# Patient Record
Sex: Female | Born: 1955 | Race: Black or African American | Hispanic: No | Marital: Single | Smoking: Never smoker
Health system: Southern US, Community
[De-identification: ages and names within clinical notes are randomized; demographics above are authoritative.]

## PROBLEM LIST (undated history)

## (undated) DIAGNOSIS — E119 Type 2 diabetes mellitus without complications: Secondary | ICD-10-CM

## (undated) DIAGNOSIS — M1990 Unspecified osteoarthritis, unspecified site: Secondary | ICD-10-CM

## (undated) DIAGNOSIS — I10 Essential (primary) hypertension: Secondary | ICD-10-CM

## (undated) DIAGNOSIS — E785 Hyperlipidemia, unspecified: Secondary | ICD-10-CM

## (undated) HISTORY — PX: OTHER SURGICAL HISTORY: SHX169

## (undated) HISTORY — DX: Hyperlipidemia, unspecified: E78.5

## (undated) HISTORY — DX: Type 2 diabetes mellitus without complications: E11.9

## (undated) HISTORY — DX: Unspecified osteoarthritis, unspecified site: M19.90

## (undated) HISTORY — DX: Essential (primary) hypertension: I10

---

## 2001-06-11 HISTORY — PX: ABDOMINAL HYSTERECTOMY: SHX81

## 2004-03-14 ENCOUNTER — Other Ambulatory Visit: Payer: Self-pay

## 2004-03-14 ENCOUNTER — Emergency Department: Payer: Self-pay | Admitting: Emergency Medicine

## 2004-08-17 ENCOUNTER — Ambulatory Visit: Payer: Self-pay | Admitting: Obstetrics and Gynecology

## 2006-04-24 ENCOUNTER — Ambulatory Visit: Payer: Self-pay | Admitting: Internal Medicine

## 2006-12-23 LAB — HM COLONOSCOPY: HM COLON: NORMAL

## 2007-06-30 ENCOUNTER — Ambulatory Visit: Payer: Self-pay | Admitting: Gastroenterology

## 2007-08-05 ENCOUNTER — Ambulatory Visit: Payer: Self-pay | Admitting: Internal Medicine

## 2007-10-23 ENCOUNTER — Ambulatory Visit: Payer: Self-pay

## 2008-01-12 ENCOUNTER — Other Ambulatory Visit: Payer: Self-pay

## 2008-01-12 ENCOUNTER — Emergency Department: Payer: Self-pay | Admitting: Emergency Medicine

## 2008-04-27 ENCOUNTER — Ambulatory Visit: Payer: Self-pay | Admitting: Unknown Physician Specialty

## 2008-06-23 ENCOUNTER — Ambulatory Visit: Payer: Self-pay | Admitting: Family Medicine

## 2008-06-29 ENCOUNTER — Ambulatory Visit: Payer: Self-pay | Admitting: Internal Medicine

## 2008-07-06 ENCOUNTER — Ambulatory Visit: Payer: Self-pay | Admitting: Internal Medicine

## 2008-11-17 ENCOUNTER — Ambulatory Visit: Payer: Self-pay | Admitting: Obstetrics and Gynecology

## 2009-06-15 ENCOUNTER — Emergency Department: Payer: Self-pay | Admitting: Emergency Medicine

## 2009-09-19 LAB — HM PAP SMEAR: HM PAP: NORMAL

## 2014-08-23 LAB — HM DIABETES FOOT EXAM: HM DIABETIC FOOT EXAM: NORMAL

## 2014-10-25 LAB — HM MAMMOGRAPHY: HM Mammogram: NEGATIVE

## 2015-04-08 ENCOUNTER — Encounter: Payer: Self-pay | Admitting: Nurse Practitioner

## 2015-04-08 ENCOUNTER — Ambulatory Visit (INDEPENDENT_AMBULATORY_CARE_PROVIDER_SITE_OTHER): Payer: BLUE CROSS/BLUE SHIELD | Admitting: Nurse Practitioner

## 2015-04-08 VITALS — BP 124/82 | HR 89 | Temp 98.4°F | Resp 14 | Ht 63.75 in | Wt 169.0 lb

## 2015-04-08 DIAGNOSIS — I10 Essential (primary) hypertension: Secondary | ICD-10-CM | POA: Diagnosis not present

## 2015-04-08 DIAGNOSIS — R0789 Other chest pain: Secondary | ICD-10-CM

## 2015-04-08 DIAGNOSIS — E785 Hyperlipidemia, unspecified: Secondary | ICD-10-CM

## 2015-04-08 DIAGNOSIS — E663 Overweight: Secondary | ICD-10-CM

## 2015-04-08 DIAGNOSIS — Z7689 Persons encountering health services in other specified circumstances: Secondary | ICD-10-CM

## 2015-04-08 DIAGNOSIS — Z7189 Other specified counseling: Secondary | ICD-10-CM

## 2015-04-08 MED ORDER — OMEPRAZOLE 40 MG PO CPDR: 40.0000 mg | DELAYED_RELEASE_CAPSULE | Freq: Every day | ORAL | Status: AC

## 2015-04-08 NOTE — Patient Instructions (Addendum)
Pneumovax (23) (pneumonia vaccine).  Zostavax at age 59 (shingles vaccine). Your insurance will tell you where to get this  Follow up in 4 weeks.   Fasting labs at the next visit- nothing to eat or drink after midnight- coffee without cream and sugar or water is okay!  Welcome to [facility]!

## 2015-04-08 NOTE — Assessment & Plan Note (Signed)
MSK or GERD in nature. Unknown what was done in the urgent care. Pt was not experiencing this today and it was not reproduced. She is willing to try Prilosec 40 mg daily. Asked her to take 30 min. Before first meal.

## 2015-04-08 NOTE — Progress Notes (Signed)
Patient ID: Brandi Mathis, female    DOB: 09/19/1955  Age: 59 y.o. MRN: [id number]  CC: Establish Care   HPI Brandi Mathis presents for establishing care and CC of right chest pain.   1) New pt info:   Immunizations- Refused flu shot, Tdap UTD, PNA- pt unsure  Mammogram- UTD   Pap- N/A  [place] ago [place]   Eye Exam- UTD glasses   Dental Exam- Dentures upper (full)  and lowers (partial)  LMP- Hysterectomy in 2003  2) Chronic Problems-  Overweight- will discuss at next visit.   HTN/HLD- Stable on current regimen   Arthritis- Elbows and wrists   DM type II- Metformin, glimepiride, checks BS 7.6 last A1c in July   3) Acute Problems-  Right chest pain- Went to UC and they gave her a muscle relaxer she reports  [facility], right breast pain/chest pain, lifting and moving into new place. Muscle relaxer- took it for 1 week and helped. Sometimes after eating and with moving still feels it. Patient reports she intermittently takes Prilosec 20 mg OTC with some relief. She does have GERD she reports.  Onset- 2 weeks ago Location- right chest Duration - intermittent Characteristics- pressure to achiness  Aggravating factors- Eating (30 min later), moving and lifting items Relieving factors- Rest  Severity- 5/10 at worst to 0/10 when at best    Ibuprofen- helpful History Brandi Mathis has a past medical history of Arthritis; Diabetes mellitus without complication (HCC); Hypertension; and Hyperlipidemia.   She has past surgical history that includes Cholecystectomy (2003).   Her family history includes Hypertension in her mother.She reports that she has never smoked. She has never used smokeless tobacco. She reports that she does not drink alcohol or use illicit drugs.  No outpatient prescriptions prior to visit.   No facility-administered medications prior to visit.    ROS Review of Systems  Constitutional: Negative for fever, chills, diaphoresis and  fatigue.  Respiratory: Negative for chest tightness, shortness of breath and wheezing.   Cardiovascular: Negative for chest pain, palpitations and leg swelling.  Gastrointestinal: Negative for nausea, vomiting, diarrhea and constipation.  Endocrine: Negative for polydipsia, polyphagia and polyuria.  Musculoskeletal: Positive for myalgias and arthralgias. Negative for back pain, gait problem and neck pain.  Skin: Negative for rash.  Neurological: Negative for dizziness, weakness, numbness and headaches.  Psychiatric/Behavioral: The patient is not nervous/anxious.     Objective:  BP 124/82 mmHg  Pulse 89  Temp(Src) 98.4 F (36.9 C)  Resp 14  Ht 5' 3.75" (1.619 m)  Wt 169 lb (76.658 kg)  BMI 29.25 kg/m2  SpO2 94%  Physical Exam  Constitutional: She is oriented to person, place, and time. She appears well-developed and well-nourished. No distress.  HENT:  Head: Normocephalic and atraumatic.  Right Ear: External ear normal.  Left Ear: External ear normal.  Cardiovascular: Normal rate and regular rhythm.   Pulmonary/Chest: Effort normal and breath sounds normal. No respiratory distress. She has no wheezes. She has no rales. She exhibits no tenderness.  Abdominal: Soft. Bowel sounds are normal. She exhibits no distension and no mass. There is no tenderness. There is no rebound and no guarding.  Musculoskeletal: Normal range of motion. She exhibits no edema or tenderness.  Put patient through several range of motion exercises and could not reproduce the pain.   Neurological: She is alert and oriented to person, place, and time. No cranial nerve deficit. She exhibits normal muscle tone. Coordination normal.  5/5 strength of biceps, deltoid, and triceps. Grip strength normal bilaterally  Skin: Skin is warm and dry. No rash noted. She is not diaphoretic.  Psychiatric: She has a normal mood and affect. Her behavior is normal. Judgment and thought content normal.   Assessment & Plan:    Brandi Mathis was seen today for establish care.  Diagnoses and all orders for this visit:  Encounter to establish care  Right-sided chest wall pain  Benign essential HTN  Hyperlipidemia  Overweight (BMI 25.0-29.9)  Other orders -     omeprazole (PRILOSEC) 40 MG capsule; Take 1 capsule (40 mg total) by mouth daily.   I am having Brandi Mathis start on omeprazole. I am also having her maintain her metFORMIN, glimepiride, losartan-hydrochlorothiazide, atorvastatin, and Co Q 10.  Meds ordered this encounter  Medications  . metFORMIN (GLUCOPHAGE) 1000 MG tablet    Sig: Take 1 tablet by mouth daily.  Marland Kitchen glimepiride (AMARYL) 4 MG tablet    Sig: Take 1 tablet by mouth daily.  Marland Kitchen losartan-hydrochlorothiazide (HYZAAR) 100-25 MG tablet    Sig: Take 1 tablet by mouth daily.  Marland Kitchen atorvastatin (LIPITOR) 20 MG tablet    Sig: Take 1 tablet by mouth daily.  . Coenzyme Q10 (CO Q 10) 10 MG CAPS    Sig: Take 1 tablet by mouth daily.  Marland Kitchen omeprazole (PRILOSEC) 40 MG capsule    Sig: Take 1 capsule (40 mg total) by mouth daily.    Dispense:  30 capsule    Refill:  0    Order Specific Question:  Supervising Provider    Answer:  Brandi Mathis [[id number]]     Follow-up: Return in about 4 weeks (around 05/06/2015) for Follow up for DM, HTN, and Fasting labs.

## 2015-04-08 NOTE — Progress Notes (Signed)
Pre visit review using our clinic review tool, if applicable. No additional management support is needed unless otherwise documented below in the visit note. 

## 2015-04-08 NOTE — Assessment & Plan Note (Signed)
BP Readings from Last 3 Encounters:  04/08/15 124/82   Pt stable on Hyzaar 100-25 mg daily. Also on a statin. Will obtain fasting lab work today.

## 2015-04-08 NOTE — Assessment & Plan Note (Signed)
Discussed acute and chronic issues. Reviewed health maintenance measures, PFSHx, and immunizations. Obtain records from previous facility. Will obtain fasting labs at next visit.

## 2015-04-08 NOTE — Assessment & Plan Note (Signed)
Statin 20 mg (lipitor) qhs. Will follow with labs at next visit.

## 2015-04-15 NOTE — Assessment & Plan Note (Signed)
Discussed healthy diet and exercise

## 2015-05-13 ENCOUNTER — Ambulatory Visit (INDEPENDENT_AMBULATORY_CARE_PROVIDER_SITE_OTHER): Payer: BLUE CROSS/BLUE SHIELD | Admitting: Nurse Practitioner

## 2015-05-13 ENCOUNTER — Other Ambulatory Visit: Payer: Self-pay

## 2015-05-13 ENCOUNTER — Encounter: Payer: Self-pay | Admitting: Nurse Practitioner

## 2015-05-13 VITALS — BP 104/80 | HR 94 | Temp 98.0°F | Wt 169.0 lb

## 2015-05-13 DIAGNOSIS — G629 Polyneuropathy, unspecified: Secondary | ICD-10-CM | POA: Insufficient documentation

## 2015-05-13 DIAGNOSIS — Z23 Encounter for immunization: Secondary | ICD-10-CM

## 2015-05-13 DIAGNOSIS — E785 Hyperlipidemia, unspecified: Secondary | ICD-10-CM

## 2015-05-13 DIAGNOSIS — E1165 Type 2 diabetes mellitus with hyperglycemia: Secondary | ICD-10-CM | POA: Diagnosis not present

## 2015-05-13 DIAGNOSIS — E1140 Type 2 diabetes mellitus with diabetic neuropathy, unspecified: Secondary | ICD-10-CM

## 2015-05-13 DIAGNOSIS — I10 Essential (primary) hypertension: Secondary | ICD-10-CM | POA: Diagnosis not present

## 2015-05-13 DIAGNOSIS — R0789 Other chest pain: Secondary | ICD-10-CM | POA: Diagnosis not present

## 2015-05-13 DIAGNOSIS — IMO0002 Reserved for concepts with insufficient information to code with codable children: Secondary | ICD-10-CM

## 2015-05-13 DIAGNOSIS — E663 Overweight: Secondary | ICD-10-CM

## 2015-05-13 DIAGNOSIS — Z13220 Encounter for screening for lipoid disorders: Secondary | ICD-10-CM

## 2015-05-13 LAB — CBC WITH DIFFERENTIAL/PLATELET
BASOS PCT: 0.6 % (ref 0.0–3.0)
Basophils Absolute: 0 10*3/uL (ref 0.0–0.1)
EOS ABS: 0.3 10*3/uL (ref 0.0–0.7)
EOS PCT: 5.9 % — AB (ref 0.0–5.0)
HCT: 37.4 % (ref 36.0–46.0)
Hemoglobin: 12.4 g/dL (ref 12.0–15.0)
LYMPHS ABS: 2.1 10*3/uL (ref 0.7–4.0)
Lymphocytes Relative: 46.7 % — ABNORMAL HIGH (ref 12.0–46.0)
MCHC: 33.1 g/dL (ref 30.0–36.0)
MCV: 89.1 fl (ref 78.0–100.0)
MONO ABS: 0.3 10*3/uL (ref 0.1–1.0)
Monocytes Relative: 7.7 % (ref 3.0–12.0)
NEUTROS PCT: 39.1 % — AB (ref 43.0–77.0)
Neutro Abs: 1.8 10*3/uL (ref 1.4–7.7)
Platelets: 406 10*3/uL — ABNORMAL HIGH (ref 150.0–400.0)
RBC: 4.2 Mil/uL (ref 3.87–5.11)
RDW: 13.2 % (ref 11.5–15.5)
WBC: 4.5 10*3/uL (ref 4.0–10.5)

## 2015-05-13 LAB — COMPREHENSIVE METABOLIC PANEL
ALT: 19 U/L (ref 0–35)
AST: 17 U/L (ref 0–37)
Albumin: 4.2 g/dL (ref 3.5–5.2)
Alkaline Phosphatase: 61 U/L (ref 39–117)
BUN: 13 mg/dL (ref 6–23)
CHLORIDE: 97 meq/L (ref 96–112)
CO2: 28 mEq/L (ref 19–32)
CREATININE: 0.9 mg/dL (ref 0.40–1.20)
Calcium: 9.9 mg/dL (ref 8.4–10.5)
GFR: 82.3 mL/min (ref >60.00)
GLUCOSE: 185 mg/dL — AB (ref 70–99)
POTASSIUM: 3.9 meq/L (ref 3.5–5.1)
SODIUM: 137 meq/L (ref 135–145)
Total Bilirubin: 0.3 mg/dL (ref 0.2–1.2)
Total Protein: 7.7 g/dL (ref 6.0–8.3)

## 2015-05-13 LAB — MICROALBUMIN / CREATININE URINE RATIO
Creatinine,U: 180.1 mg/dL
MICROALB/CREAT RATIO: 2 mg/g (ref 0.0–30.0)
Microalb, Ur: 3.6 mg/dL — ABNORMAL HIGH (ref 0.0–1.9)

## 2015-05-13 LAB — HEMOGLOBIN A1C: Hgb A1c MFr Bld: 9 % — ABNORMAL HIGH (ref 4.6–6.5)

## 2015-05-13 LAB — LIPID PANEL
CHOL/HDL RATIO: 6
Cholesterol: 177 mg/dL (ref 0–200)
HDL: 30.3 mg/dL — ABNORMAL LOW (ref >39.00)
LDL CALC: 110 mg/dL — AB (ref 0–99)
NONHDL: 147.05
Triglycerides: 183 mg/dL — ABNORMAL HIGH (ref 0.0–149.0)
VLDL: 36.6 mg/dL (ref 0.0–40.0)

## 2015-05-13 MED ORDER — ATORVASTATIN CALCIUM 20 MG PO TABS: 20.0000 mg | ORAL_TABLET | Freq: Every day | ORAL | Status: AC

## 2015-05-13 MED ORDER — LOSARTAN POTASSIUM-HCTZ 100-25 MG PO TABS: 1.0000 | ORAL_TABLET | Freq: Every day | ORAL | Status: AC

## 2015-05-13 MED ORDER — AMITRIPTYLINE HCL 10 MG PO TABS: 10.0000 mg | ORAL_TABLET | Freq: Every day | ORAL | Status: DC

## 2015-05-13 MED ORDER — METFORMIN HCL 1000 MG PO TABS: 1000.0000 mg | ORAL_TABLET | Freq: Two times a day (BID) | ORAL | Status: DC

## 2015-05-13 MED ORDER — METFORMIN HCL 1000 MG PO TABS: 1000.0000 mg | ORAL_TABLET | Freq: Every day | ORAL | Status: DC

## 2015-05-13 MED ORDER — GLIMEPIRIDE 1 MG PO TABS: 1.0000 mg | ORAL_TABLET | Freq: Two times a day (BID) | ORAL | Status: DC

## 2015-05-13 NOTE — Assessment & Plan Note (Signed)
Will send to podiatry. Does not like Elavil, but still takes 1 at night. Discussed changing medications, but she would like an evaluation.

## 2015-05-13 NOTE — Assessment & Plan Note (Signed)
Encouraged healthy diet and exercise

## 2015-05-13 NOTE — Assessment & Plan Note (Signed)
Improved with prilosec use. Takes OTC 20 mg twice daily

## 2015-05-13 NOTE — Assessment & Plan Note (Signed)
Last A1c self reported 7.9 approx. Will repeat today, add A1c and CMET, check lipid panel as well since fasting. Pneumovax 23 was given today. Referral to podiatry was given at request of pt due to neuropathy of feet.

## 2015-05-13 NOTE — Assessment & Plan Note (Signed)
Checking lipid panel since fasting today. Refilled Lipitor.

## 2015-05-13 NOTE — Progress Notes (Signed)
Patient ID: Brandi Mathis, female    DOB: 07-28-1955  Age: 59 y.o. MRN: [id number]  CC: Follow-up   HPI Biviana Robin presents for follow up of Right chest wall pain.   1) Started on Prilosec 40 mg before meals at last visit (takes 2, 20 mg tablets daily from OTC)  Improved, no further episodes since last visit in Oct.  Diabetic-on Metformin 2000 mg daily  Glimipiride 1 mg twice daily  Pneumonia vaccine today- denies having in past  2) Neuropathy- Shooting pains in pad of foot 2 years off and on  Feet tingling all over 2 years Wears trouser socks and sketchers most of the time Does not wear compression hose  History Cambreigh has a past medical history of Arthritis; Diabetes mellitus without complication ([place]); Hypertension; and Hyperlipidemia.   She has past surgical history that includes Cholecystectomy (2003).   Her family history includes Hypertension in her mother.She reports that she has never smoked. She has never used smokeless tobacco. She reports that she does not drink alcohol or use illicit drugs.  Outpatient Prescriptions Prior to Visit  Medication Sig Dispense Refill  . Coenzyme Q10 (CO Q 10) 10 MG CAPS Take 200 mg by mouth daily.     Marland Kitchen omeprazole (PRILOSEC) 40 MG capsule Take 1 capsule (40 mg total) by mouth daily. 30 capsule 0  . atorvastatin (LIPITOR) 20 MG tablet Take 1 tablet by mouth daily.    Marland Kitchen glimepiride (AMARYL) 4 MG tablet Take 1 mg by mouth 2 (two) times daily.     Marland Kitchen losartan-hydrochlorothiazide (HYZAAR) 100-25 MG tablet Take 1 tablet by mouth daily.    . metFORMIN (GLUCOPHAGE) 1000 MG tablet Take 1 tablet by mouth daily.     No facility-administered medications prior to visit.    ROS Review of Systems  Constitutional: Negative for fever, chills, diaphoresis and fatigue.  Respiratory: Negative for chest tightness, shortness of breath and wheezing.   Cardiovascular: Negative for chest pain, palpitations and leg swelling.  Gastrointestinal: Negative for  nausea, vomiting and diarrhea.  Endocrine: Negative for polydipsia, polyphagia and polyuria.  Skin: Negative for rash.  Neurological: Negative for dizziness, weakness, numbness and headaches.  Psychiatric/Behavioral: The patient is not nervous/anxious.     Objective:  BP 104/80 mmHg  Pulse 94  Temp(Src) 98 F (36.7 C) (Oral)  Wt 169 lb (76.658 kg)  SpO2 98%  Physical Exam  Constitutional: She is oriented to person, place, and time. She appears well-developed and well-nourished. No distress.  HENT:  Head: Normocephalic and atraumatic.  Right Ear: External ear normal.  Left Ear: External ear normal.  Cardiovascular: Normal rate, regular rhythm and normal heart sounds.  Exam reveals no gallop and no friction rub.   No murmur heard. Pulmonary/Chest: Effort normal and breath sounds normal. No respiratory distress. She has no wheezes. She has no rales. She exhibits no tenderness.  Neurological: She is alert and oriented to person, place, and time. No cranial nerve deficit. She exhibits normal muscle tone. Coordination normal.  Feet checked for wounds, adequate sensation all over  Skin: Skin is warm and dry. No rash noted. She is not diaphoretic.  Psychiatric: She has a normal mood and affect. Her behavior is normal. Judgment and thought content normal.   Assessment & Plan:   Perl was seen today for follow-up.  Diagnoses and all orders for this visit:  Neuropathy ([place]) -     Ambulatory referral to Podiatry -     HgB A1c -  Urine Microalbumin w/creat. ratio -     CBC w/Diff -     Comp Met (CMET)  Uncontrolled type 2 diabetes mellitus with diabetic neuropathy, without long-term current use of insulin (HCC) -     HgB A1c -     Urine Microalbumin w/creat. ratio -     Comp Met (CMET)  Screening for hyperlipidemia -     Lipid Profile  Need for 23-polyvalent pneumococcal polysaccharide vaccine -     Pneumococcal polysaccharide vaccine 23-valent greater than or equal to 2yo  subcutaneous/IM  Benign essential HTN  Right-sided chest wall pain  Overweight (BMI 25.0-29.9)  Hyperlipidemia  Other orders -     atorvastatin (LIPITOR) 20 MG tablet; Take 1 tablet (20 mg total) by mouth daily. -     losartan-hydrochlorothiazide (HYZAAR) 100-25 MG tablet; Take 1 tablet by mouth daily. -     Discontinue: metFORMIN (GLUCOPHAGE) 1000 MG tablet; Take 1 tablet (1,000 mg total) by mouth daily. (Patient taking differently: Take 1,000 mg by mouth 2 (two) times daily. ) -     glimepiride (AMARYL) 1 MG tablet; Take 1 tablet (1 mg total) by mouth 2 (two) times daily. With Breakfast -     amitriptyline (ELAVIL) 10 MG tablet; Take 1 tablet (10 mg total) by mouth at bedtime.   I have discontinued Ms. Fiorentino's metFORMIN and glimepiride. I have also changed her atorvastatin and amitriptyline. Additionally, I am having her start on glimepiride. Lastly, I am having her maintain her Co Q 10, omeprazole, and losartan-hydrochlorothiazide.  Meds ordered this encounter  Medications  . DISCONTD: amitriptyline (ELAVIL) 10 MG tablet    Sig: Take 10 mg by mouth 3 (three) times daily as needed for sleep.  Marland Kitchen atorvastatin (LIPITOR) 20 MG tablet    Sig: Take 1 tablet (20 mg total) by mouth daily.    Dispense:  90 tablet    Refill:  1    Order Specific Question:  Supervising Provider    Answer:  Deborra Medina L [[id number]]  . losartan-hydrochlorothiazide (HYZAAR) 100-25 MG tablet    Sig: Take 1 tablet by mouth daily.    Dispense:  90 tablet    Refill:  1    Order Specific Question:  Supervising Provider    Answer:  Deborra Medina L [[id number]]  . DISCONTD: metFORMIN (GLUCOPHAGE) 1000 MG tablet    Sig: Take 1 tablet (1,000 mg total) by mouth daily.    Dispense:  90 tablet    Refill:  1    Order Specific Question:  Supervising Provider    Answer:  Deborra Medina L [[id number]]  . glimepiride (AMARYL) 1 MG tablet    Sig: Take 1 tablet (1 mg total) by mouth 2 (two) times daily. With Breakfast    Dispense:   180 tablet    Refill:  1    Order Specific Question:  Supervising Provider    Answer:  Deborra Medina L [[id number]]  . amitriptyline (ELAVIL) 10 MG tablet    Sig: Take 1 tablet (10 mg total) by mouth at bedtime.    Dispense:  30 tablet    Refill:  1    Order Specific Question:  Supervising Provider    Answer:  Crecencio Mc [[id number]]     Follow-up: Return in about 6 months (around 11/11/2015) for Follow up of DM.

## 2015-05-13 NOTE — Assessment & Plan Note (Addendum)
BP Readings from Last 3 Encounters:  05/13/15 104/80  04/08/15 124/82   Stable currently. Checking CMET today. Refilled medication for 6 months

## 2015-05-13 NOTE — Patient Instructions (Signed)
Please visit the lab before leaving today.  We will call you with your lab results and Podiatry referral.  Follow up in 6 months and Happy Holidays!

## 2015-05-18 ENCOUNTER — Other Ambulatory Visit: Payer: Self-pay

## 2015-05-18 MED ORDER — GLIMEPIRIDE 1 MG PO TABS: 1.0000 mg | ORAL_TABLET | Freq: Two times a day (BID) | ORAL | Status: DC

## 2015-05-20 ENCOUNTER — Telehealth: Payer: Self-pay | Admitting: *Deleted

## 2015-05-20 NOTE — Telephone Encounter (Signed)
Attempted to call patient, no answer 

## 2015-05-20 NOTE — Telephone Encounter (Signed)
Patient requesting lab resulted from 05/13/15. Pease advise

## 2015-05-20 NOTE — Telephone Encounter (Signed)
[facility]oke with a tech at [facility] clarified prescription.

## 2015-05-20 NOTE — Telephone Encounter (Signed)
[facility] from [facility] has requested a clarification on a Rx that was sent over. Please Advise

## 2015-05-25 ENCOUNTER — Ambulatory Visit (INDEPENDENT_AMBULATORY_CARE_PROVIDER_SITE_OTHER): Payer: BLUE CROSS/BLUE SHIELD | Admitting: Nurse Practitioner

## 2015-05-25 ENCOUNTER — Encounter: Payer: Self-pay | Admitting: Nurse Practitioner

## 2015-05-25 VITALS — BP 102/70 | HR 76 | Temp 98.2°F | Ht 63.75 in | Wt 169.4 lb

## 2015-05-25 DIAGNOSIS — E1140 Type 2 diabetes mellitus with diabetic neuropathy, unspecified: Secondary | ICD-10-CM

## 2015-05-25 DIAGNOSIS — E1165 Type 2 diabetes mellitus with hyperglycemia: Secondary | ICD-10-CM | POA: Diagnosis not present

## 2015-05-25 DIAGNOSIS — IMO0002 Reserved for concepts with insufficient information to code with codable children: Secondary | ICD-10-CM

## 2015-05-25 MED ORDER — METFORMIN HCL 1000 MG PO TABS: 1000.0000 mg | ORAL_TABLET | Freq: Two times a day (BID) | ORAL | Status: DC

## 2015-05-25 NOTE — Patient Instructions (Signed)
Continue to do well with diet and exercise.

## 2015-05-25 NOTE — Progress Notes (Signed)
Patient ID: Brandi Mathis, female    DOB: Jul 27, 1955  Age: 59 y.o. MRN: [id number]  CC: Follow-up   HPI Brandi Mathis presents for follow up of uncontrolled A1c.  1) Blood sugars- she is typically checking 2-3 times a day. The range is from 95-208.   Moving states and eating all kinds of junk food she reports Missed medications also while moving  Patient reports last few weeks she has started going to the gym 3-4 x a week Eating healthy diet  Stopped sodas  Wheat bread, eats meats   History Brandi Mathis has a past medical history of Arthritis; Diabetes mellitus without complication (HCC); Hypertension; and Hyperlipidemia.   She has past surgical history that includes Cholecystectomy (2003).   Her family history includes Hypertension in her mother.She reports that she has never smoked. She has never used smokeless tobacco. She reports that she does not drink alcohol or use illicit drugs.  Outpatient Prescriptions Prior to Visit  Medication Sig Dispense Refill  . amitriptyline (ELAVIL) 10 MG tablet Take 1 tablet (10 mg total) by mouth at bedtime. 30 tablet 1  . atorvastatin (LIPITOR) 20 MG tablet Take 1 tablet (20 mg total) by mouth daily. 90 tablet 1  . Coenzyme Q10 (CO Q 10) 10 MG CAPS Take 200 mg by mouth daily.     Marland Kitchen. glimepiride (AMARYL) 1 MG tablet Take 1 tablet (1 mg total) by mouth 2 (two) times daily. 180 tablet 1  . losartan-hydrochlorothiazide (HYZAAR) 100-25 MG tablet Take 1 tablet by mouth daily. 90 tablet 1  . omeprazole (PRILOSEC) 40 MG capsule Take 1 capsule (40 mg total) by mouth daily. 30 capsule 0  . metFORMIN (GLUCOPHAGE) 1000 MG tablet Take 1 tablet (1,000 mg total) by mouth 2 (two) times daily. 90 tablet 1   No facility-administered medications prior to visit.    ROS Review of Systems  Constitutional: Negative for fever, chills, diaphoresis and fatigue.  Respiratory: Negative for chest tightness, shortness of breath and wheezing.   Cardiovascular: Negative for  chest pain, palpitations and leg swelling.  Gastrointestinal: Negative for nausea, vomiting and diarrhea.  Endocrine: Negative for polydipsia, polyphagia and polyuria.  Skin: Negative for rash.  Neurological: Negative for dizziness, weakness, numbness and headaches.  Psychiatric/Behavioral: The patient is not nervous/anxious.     Objective:  BP 102/70 mmHg  Pulse 76  Temp(Src) 98.2 F (36.8 C) (Oral)  Ht 5' 3.75" (1.619 m)  Wt 169 lb 6.4 oz (76.839 kg)  BMI 29.31 kg/m2  Physical Exam  Constitutional: She is oriented to person, place, and time. She appears well-developed and well-nourished. No distress.  HENT:  Head: Normocephalic and atraumatic.  Right Ear: External ear normal.  Left Ear: External ear normal.  Cardiovascular: Normal rate, regular rhythm and normal heart sounds.  Exam reveals no gallop and no friction rub.   No murmur heard. Pulmonary/Chest: Effort normal and breath sounds normal. No respiratory distress. She has no wheezes. She has no rales. She exhibits no tenderness.  Neurological: She is alert and oriented to person, place, and time. No cranial nerve deficit. She exhibits normal muscle tone. Coordination normal.  Skin: Skin is warm and dry. No rash noted. She is not diaphoretic.  Psychiatric: She has a normal mood and affect. Her behavior is normal. Judgment and thought content normal.   Assessment & Plan:   Brandi Mathis was seen today for follow-up.  Diagnoses and all orders for this visit:  Uncontrolled type 2 diabetes mellitus with diabetic neuropathy, without long-term current  use of insulin (HCC)  Other orders -     Discontinue: metFORMIN (GLUCOPHAGE) 1000 MG tablet; Take 1 tablet (1,000 mg total) by mouth 2 (two) times daily. -     metFORMIN (GLUCOPHAGE) 1000 MG tablet; Take 1 tablet (1,000 mg total) by mouth 2 (two) times daily.  I am having Brandi Mathis maintain her Co Q 10, omeprazole, atorvastatin, losartan-hydrochlorothiazide, amitriptyline,  glimepiride, and metFORMIN.  Meds ordered this encounter  Medications  . DISCONTD: metFORMIN (GLUCOPHAGE) 1000 MG tablet    Sig: Take 1 tablet (1,000 mg total) by mouth 2 (two) times daily.    Dispense:  180 tablet    Refill:  0    Order Specific Question:  Supervising Provider    Answer:  Duncan Dull L [[id number]]  . metFORMIN (GLUCOPHAGE) 1000 MG tablet    Sig: Take 1 tablet (1,000 mg total) by mouth 2 (two) times daily.    Dispense:  180 tablet    Refill:  0    Order Specific Question:  Supervising Provider    Answer:  Sherlene Shams [[id number]]     Follow-up: Return if symptoms worsen or fail to improve.

## 2015-06-01 NOTE — Assessment & Plan Note (Signed)
I have refilled her metformin and sent this to the pharmacy. Encouraged her to keep up the good work with diet and exercise and make sure to check her blood sugars once to 2 times daily. Let us know if she has anything below 70 or anything above 300. We'll follow-up in 3 months from last A1c.

## 2015-06-15 ENCOUNTER — Encounter: Payer: Self-pay | Admitting: Podiatry

## 2015-06-15 ENCOUNTER — Ambulatory Visit (INDEPENDENT_AMBULATORY_CARE_PROVIDER_SITE_OTHER): Payer: BLUE CROSS/BLUE SHIELD | Admitting: Podiatry

## 2015-06-15 ENCOUNTER — Ambulatory Visit (INDEPENDENT_AMBULATORY_CARE_PROVIDER_SITE_OTHER): Payer: BLUE CROSS/BLUE SHIELD

## 2015-06-15 DIAGNOSIS — M778 Other enthesopathies, not elsewhere classified: Secondary | ICD-10-CM

## 2015-06-15 DIAGNOSIS — E1142 Type 2 diabetes mellitus with diabetic polyneuropathy: Secondary | ICD-10-CM | POA: Diagnosis not present

## 2015-06-15 DIAGNOSIS — M7750 Other enthesopathy of unspecified foot: Secondary | ICD-10-CM

## 2015-06-15 DIAGNOSIS — R52 Pain, unspecified: Secondary | ICD-10-CM

## 2015-06-15 DIAGNOSIS — M779 Enthesopathy, unspecified: Secondary | ICD-10-CM

## 2015-06-15 NOTE — Progress Notes (Signed)
   Subjective:    Patient ID: Brandi Mathis, female    DOB: 1955/10/29, 60 y.o.   MRN: [id number]  HPI she presents today as a 60 year old black female with a history of venous distention in her feet by the end of the day numbness with tingling at night. She states that my fee just a cold all the time. She also states that she has an electrical type shooting pain through the first metatarsophalangeal joints that can last from only a few seconds to a couple of days at a time. She denies any back problems denies any other neurologic history. She states that she has been a diabetic for 18 years and her A1c typically runs around a 7 though as of late it has been a high as 9.0. She is currently taking Elavil for her night pain.    Review of Systems  All other systems reviewed and are negative.      Objective:   Physical Exam: 60 year old black female vital signs stable alert and oriented 3 no acute distress. I have reviewed her past medical history medications allergy surgery social history and review of systems. Pulses are strongly palpable. No venous distention at this point and I do not visualize any varicose veins. Neurologic sensorium is intact per Semmes-Weinstein monofilament. Deep tendon reflexes are intact bilateral muscle strength is 5 over 5 dorsiflexion plantar flexors and inverters everters all of his musculature is intact. Orthopedic evaluation does demonstrate mild hallux valgus deformities and mild flexible hammertoe deformities bilateral mild pes planus. No major orthopedic abnormalities. Cutaneous evaluation demonstrates supple well-hydrated cutis no erythema edema saline as drainage or odor. Radiographs demonstrate normal osseous architecture without fractures.      Assessment & Plan:  Assessment: Early diabetic peripheral neuropathy without the loss of protective sensation at this point.  Plan: Discussed etiology and pathology conservative versus surgical therapies I recommended  that she continue the Elavil but to try to take it more consistently. I also recommended compression socks or light compression hose. I will follow up with her in 6 months.

## 2015-06-16 ENCOUNTER — Encounter: Payer: Self-pay | Admitting: Nurse Practitioner

## 2015-06-16 ENCOUNTER — Encounter: Payer: Self-pay | Admitting: Podiatry

## 2015-06-16 NOTE — Telephone Encounter (Signed)
I reviewed pt's medication orders and the Elavil had been prescribed by Naomie Deanarrie Doss, NP, not Dr. Al CorpusHyatt.  I left message instructing pt to contact Naomie Deanarrie Doss, NP for any changes in the Elavil dosing.

## 2015-06-17 ENCOUNTER — Other Ambulatory Visit: Payer: Self-pay | Admitting: Nurse Practitioner

## 2015-06-20 ENCOUNTER — Telehealth: Payer: Self-pay | Admitting: *Deleted

## 2015-06-20 NOTE — Telephone Encounter (Signed)
I sent her a message about this via MyChart- she needs to clarify who wrote it originally like this and why. This is not typical of this medication. Thank you

## 2015-06-20 NOTE — Telephone Encounter (Signed)
Please advise 

## 2015-06-20 NOTE — Telephone Encounter (Signed)
Patient stated that her medication amitriptline 10mg  should be written 1-3 tablets at bedtime as needed. The pharmacy only gave her 30 tablets, and she stated that she should have 90 tablets. Please advise

## 2015-06-22 ENCOUNTER — Other Ambulatory Visit: Payer: Self-pay | Admitting: Nurse Practitioner

## 2015-06-22 MED ORDER — AMITRIPTYLINE HCL 10 MG PO TABS: ORAL_TABLET | ORAL | Status: AC

## 2015-06-22 MED ORDER — AMITRIPTYLINE HCL 10 MG PO TABS: ORAL_TABLET | ORAL | Status: DC

## 2015-06-22 NOTE — Telephone Encounter (Signed)
Pt called back she states that she read the Mychart but is not sure if her message would be understood so pt would like to speak to someone to clarify what she is trying to say. Call pt @ [phone]. Thank You!

## 2015-06-22 NOTE — Telephone Encounter (Signed)
Clarified with patient, and [facility] by C[facility] Doss this am.

## 2015-09-18 ENCOUNTER — Other Ambulatory Visit: Payer: Self-pay | Admitting: Nurse Practitioner

## 2015-12-14 ENCOUNTER — Ambulatory Visit: Payer: BLUE CROSS/BLUE SHIELD | Admitting: Podiatry

## 2015-12-14 DIAGNOSIS — E7800 Pure hypercholesterolemia, unspecified: Secondary | ICD-10-CM | POA: Insufficient documentation

## 2016-03-15 ENCOUNTER — Ambulatory Visit: Payer: BLUE CROSS/BLUE SHIELD | Admitting: Family

## 2016-03-22 ENCOUNTER — Ambulatory Visit: Payer: BLUE CROSS/BLUE SHIELD | Admitting: Family

## 2016-04-05 ENCOUNTER — Encounter: Payer: Self-pay | Admitting: Family

## 2016-04-05 ENCOUNTER — Other Ambulatory Visit (INDEPENDENT_AMBULATORY_CARE_PROVIDER_SITE_OTHER): Payer: BLUE CROSS/BLUE SHIELD

## 2016-04-05 ENCOUNTER — Ambulatory Visit (INDEPENDENT_AMBULATORY_CARE_PROVIDER_SITE_OTHER): Payer: BLUE CROSS/BLUE SHIELD | Admitting: Family

## 2016-04-05 VITALS — BP 120/85 | HR 107 | Temp 97.9°F | Ht 64.0 in | Wt 174.0 lb

## 2016-04-05 DIAGNOSIS — Z7689 Persons encountering health services in other specified circumstances: Secondary | ICD-10-CM

## 2016-04-05 DIAGNOSIS — E1140 Type 2 diabetes mellitus with diabetic neuropathy, unspecified: Secondary | ICD-10-CM | POA: Diagnosis not present

## 2016-04-05 DIAGNOSIS — G4733 Obstructive sleep apnea (adult) (pediatric): Secondary | ICD-10-CM | POA: Diagnosis not present

## 2016-04-05 DIAGNOSIS — IMO0002 Reserved for concepts with insufficient information to code with codable children: Secondary | ICD-10-CM

## 2016-04-05 DIAGNOSIS — R143 Flatulence: Secondary | ICD-10-CM | POA: Diagnosis not present

## 2016-04-05 DIAGNOSIS — K219 Gastro-esophageal reflux disease without esophagitis: Secondary | ICD-10-CM

## 2016-04-05 DIAGNOSIS — I10 Essential (primary) hypertension: Secondary | ICD-10-CM

## 2016-04-05 DIAGNOSIS — E1165 Type 2 diabetes mellitus with hyperglycemia: Secondary | ICD-10-CM | POA: Diagnosis not present

## 2016-04-05 DIAGNOSIS — R0789 Other chest pain: Secondary | ICD-10-CM

## 2016-04-05 LAB — HEMOGLOBIN A1C: HEMOGLOBIN A1C: 9.7 % — AB (ref 4.6–6.5)

## 2016-04-05 LAB — COMPREHENSIVE METABOLIC PANEL
ALBUMIN: 4.7 g/dL (ref 3.5–5.2)
ALK PHOS: 55 U/L (ref 39–117)
ALT: 33 U/L (ref 0–35)
AST: 25 U/L (ref 0–37)
BILIRUBIN TOTAL: 0.4 mg/dL (ref 0.2–1.2)
BUN: 20 mg/dL (ref 6–23)
CALCIUM: 10.4 mg/dL (ref 8.4–10.5)
CO2: 30 mEq/L (ref 19–32)
CREATININE: 0.99 mg/dL (ref 0.40–1.20)
Chloride: 96 mEq/L (ref 96–112)
GFR: 73.5 mL/min (ref >60.00)
Glucose, Bld: 203 mg/dL — ABNORMAL HIGH (ref 70–99)
Potassium: 4.2 mEq/L (ref 3.5–5.1)
Sodium: 137 mEq/L (ref 135–145)
Total Protein: 7.6 g/dL (ref 6.0–8.3)

## 2016-04-05 NOTE — Assessment & Plan Note (Signed)
Reviewed recent lab from from prior PCP, Dr. Thedore MinsSingh. Patient will return at f/u and we will review preventative care and ensure patient is UTD.

## 2016-04-05 NOTE — Progress Notes (Signed)
Pre visit review using our clinic review tool, if applicable. No additional management support is needed unless otherwise documented below in the visit note. 

## 2016-04-05 NOTE — Assessment & Plan Note (Signed)
H/o OSA. No cipap at this time. Referral to sleep study for formal diagnosis and new machine.

## 2016-04-05 NOTE — Patient Instructions (Signed)
RUQ US Labs today. Follow up one month.

## 2016-04-05 NOTE — Assessment & Plan Note (Signed)
Pending a1c. Continue current regimen at this time.

## 2016-04-05 NOTE — Addendum Note (Signed)
Addended by: Felix AhmadiFRANSEN, Asta Corbridge A on: 04/05/2016 01:57 PM   Modules accepted: Orders

## 2016-04-05 NOTE — Assessment & Plan Note (Signed)
Some improvement on prilosec. Unclear if gas complaint is related to GERD. Continue prilosec at this time and referral placed for colonoscopy and EGD to evaluate. Will continue to follow.

## 2016-04-05 NOTE — Assessment & Plan Note (Signed)
Controlled. Continue current regimen. 

## 2016-04-05 NOTE — Assessment & Plan Note (Addendum)
Chronic. Unchanged. Had improved on probiotics and now returned. Pending GI referral, RUQ US, and labs to look for identifiable cause. Reassured by benign abdominal exam. Considering H pylori, gallstone etiology. Will follow.

## 2016-04-05 NOTE — Progress Notes (Signed)
Subjective:    Patient ID: Brandi Mathis, female    DOB: 02/27/1956, 60 y.o.   MRN: [id number]  CC: Brandi PasseDarth Vieth is a 60 y.o. female who presents today for an acute visit.    HPI: Patient here for acute visit with chief complaint right sided 'gas' pain which 'always occurs' after meals. Has been coming and going for about a year. Unchanged. Had been seeing Dr Thedore [facility]Singh at [facility] recently since Pal[place]ar[place], NP left (since she wanted to see female doctor) and now would like to establish care.   Had been on priolosec for ingestion which is not helping.  She has a lot of gas which improved with probiotics. After taking probiotics , felt epigastric gas worsened and 'it just sits there.' No pain in the chest. H/o hemorrhoids and sees occasional blood on toilet paper after straining.   Denies exertional chest pain or pressure, numbness or tingling radiating to left arm or jaw, palpitations, dizziness, frequent headaches, changes in vision, or shortness of breath. No chest pain with exercise. Exercises 3 times per week.  No h/o asthma.       Labs done 12/2015. AST/ALT normal. Last A1C 8.8. Elevated TGs. TSH normal. Urine albumin 16.  HISTORY:  Past Medical History:  Diagnosis Date  . Arthritis   . Diabetes mellitus without complication (HCC)   . Hyperlipidemia   . Hypertension    Past Surgical History:  Procedure Laterality Date  . ABDOMINAL HYSTERECTOMY  2003   unsure if ovaries still there   Family History  Problem Relation Age of Onset  . Hypertension Mother     Allergies: Review of patient's allergies indicates no known allergies. Current Outpatient Prescriptions on File Prior to Visit  Medication Sig Dispense Refill  . amitriptyline (ELAVIL) 10 MG tablet Take 1-3 tablets at night by mouth 270 tablet 1  . atorvastatin (LIPITOR) 20 MG tablet Take 1 tablet (20 mg total) by mouth daily. 90 tablet 1  . Coenzyme Q10 (CO Q 10) 10 MG CAPS Take 200 mg by mouth daily.     Marland Kitchen.  losartan-hydrochlorothiazide (HYZAAR) 100-25 MG tablet Take 1 tablet by mouth daily. 90 tablet 1  . metFORMIN (GLUCOPHAGE) 1000 MG tablet TAKE 1 TABLET TWICE A DAY 180 tablet 1  . omeprazole (PRILOSEC) 40 MG capsule Take 1 capsule (40 mg total) by mouth daily. 30 capsule 0   No current facility-administered medications on file prior to visit.     Social History  Substance Use Topics  . Smoking status: Never Smoker  . Smokeless tobacco: Never Used  . Alcohol use No    Review of Systems  Constitutional: Negative for chills, fever and unexpected weight change.  Eyes: Negative for visual disturbance.  Respiratory: Negative for cough, shortness of breath and wheezing.   Cardiovascular: Negative for chest pain and palpitations.  Gastrointestinal: Positive for anal bleeding. Negative for abdominal distention, abdominal pain, blood in stool, constipation, diarrhea, nausea and vomiting.  Neurological: Negative for dizziness and headaches.      Objective:    BP 120/85   Pulse (!) 107   Temp 97.9 F (36.6 C) (Oral)   Ht 5\' 4"  (1.626 m)   Wt 174 lb (78.9 kg)   SpO2 97%   BMI 29.87 kg/m    Physical Exam  Constitutional: She appears well-developed and well-nourished.  Eyes: Conjunctivae are normal.  Cardiovascular: Normal rate, regular rhythm, normal heart sounds and normal pulses.   Pulmonary/Chest: Effort normal and breath sounds normal. She  has no wheezes. She has no rhonchi. She has no rales.  Abdominal: Soft. Normal appearance and bowel sounds are normal. She exhibits no distension, no fluid wave, no ascites and no mass. There is no tenderness. There is no rigidity, no rebound, no guarding and no CVA tenderness.  Neurological: She is alert.  Skin: Skin is warm and dry.  Psychiatric: She has a normal mood and affect. Her speech is normal and behavior is normal. Thought content normal.  Vitals reviewed.      Assessment & Plan:   Problem List Items Addressed This Visit       Cardiovascular and Mediastinum   Benign essential HTN    Controlled. Continue current regimen.        Respiratory   OSA (obstructive sleep apnea)    H/o OSA. No cipap at this time. Referral to sleep study for formal diagnosis and new machine.       Relevant Orders   Ambulatory referral to Sleep Studies     Digestive   Gastroesophageal reflux disease    Some improvement on prilosec. Unclear if gas complaint is related to GERD. Continue prilosec at this time and referral placed for colonoscopy and EGD to evaluate. Will continue to follow.       Relevant Orders   Ambulatory referral to Gastroenterology   H. pylori antibody, IgG     Endocrine   Diabetes mellitus type II, uncontrolled (HCC)    Pending a1c. Continue current regimen at this time.      Relevant Medications   glimepiride (AMARYL) 2 MG tablet   Other Relevant Orders   Hemoglobin A1c     Other   Encounter to establish care    Reviewed recent lab from from prior PCP, Dr. Thedore Mins. Patient will return at f/u and we will review preventative care and ensure patient is UTD.       Right-sided chest wall pain    Etiology nonspecific. Considering GI etiology from excessive gas, GERD. Reassured by no cardiac symptoms. Will follow.      Relevant Orders   US Abdomen Limited RUQ   Excessive gas - Primary    Chronic. Unchanged. Had improved on probiotics and now returned. Pending GI referral, RUQ [place], and labs to look for identifiable cause. Reassured by benign abdominal exam. Considering H pylori, gallstone etiology. Will follow.       Relevant Orders   Comprehensive metabolic panel   US Abdomen Limited RUQ   H. pylori antibody, IgG    Other Visit Diagnoses   None.        I am having Ms. Zupko maintain her Co Q 10, omeprazole, atorvastatin, losartan-hydrochlorothiazide, amitriptyline, metFORMIN, and glimepiride.   Meds ordered this encounter  Medications  . glimepiride (AMARYL) 2 MG tablet    Sig: Take by  mouth.    Return precautions given.   Risks, benefits, and alternatives of the medications and treatment plan prescribed today were discussed, and patient expressed understanding.   Education regarding symptom management and diagnosis given to patient on AVS.  Continue to follow with Rennie Plowman, FNP for routine health maintenance.   Mckynlee Kochel and I agreed with plan.   Rennie Plowman, FNP

## 2016-04-05 NOTE — Assessment & Plan Note (Signed)
Etiology nonspecific. Considering GI etiology from excessive gas, GERD. Reassured by no cardiac symptoms. Will follow.

## 2016-04-06 LAB — H. PYLORI ANTIBODY, IGG: H PYLORI IGG: NEGATIVE

## 2016-04-08 ENCOUNTER — Other Ambulatory Visit: Payer: Self-pay | Admitting: Family

## 2016-04-08 DIAGNOSIS — E119 Type 2 diabetes mellitus without complications: Secondary | ICD-10-CM

## 2016-04-12 ENCOUNTER — Ambulatory Visit
Admission: RE | Admit: 2016-04-12 | Discharge: 2016-04-12 | Disposition: A | Discharge status: Home / Self Care | Payer: BLUE CROSS/BLUE SHIELD | Source: Ambulatory Visit | Attending: Family | Admitting: Family

## 2016-04-12 DIAGNOSIS — R0789 Other chest pain: Secondary | ICD-10-CM | POA: Diagnosis present

## 2016-04-12 DIAGNOSIS — R143 Flatulence: Secondary | ICD-10-CM

## 2016-04-12 DIAGNOSIS — R932 Abnormal findings on diagnostic imaging of liver and biliary tract: Secondary | ICD-10-CM | POA: Diagnosis not present

## 2016-04-17 ENCOUNTER — Ambulatory Visit: Payer: Self-pay | Admitting: Gastroenterology

## 2016-04-17 ENCOUNTER — Other Ambulatory Visit: Payer: Self-pay

## 2016-04-17 ENCOUNTER — Ambulatory Visit (INDEPENDENT_AMBULATORY_CARE_PROVIDER_SITE_OTHER): Payer: BLUE CROSS/BLUE SHIELD | Admitting: Gastroenterology

## 2016-04-17 ENCOUNTER — Encounter: Payer: Self-pay | Admitting: Gastroenterology

## 2016-04-17 VITALS — BP 151/94 | HR 109 | Temp 98.5°F | Ht 64.0 in | Wt 174.0 lb

## 2016-04-17 DIAGNOSIS — Z8371 Family history of colonic polyps: Secondary | ICD-10-CM

## 2016-04-17 DIAGNOSIS — R140 Abdominal distension (gaseous): Secondary | ICD-10-CM | POA: Diagnosis not present

## 2016-04-17 DIAGNOSIS — R194 Change in bowel habit: Secondary | ICD-10-CM

## 2016-04-17 DIAGNOSIS — K625 Hemorrhage of anus and rectum: Secondary | ICD-10-CM | POA: Diagnosis not present

## 2016-04-17 MED ORDER — DOCUSATE SODIUM 100 MG PO CAPS: 100.0000 mg | ORAL_CAPSULE | Freq: Two times a day (BID) | ORAL | 0 refills | Status: AC

## 2016-04-17 NOTE — Progress Notes (Signed)
Gastroenterology Consultation  Referring Provider:     Allegra GranaArnett, Margaret G, FNP Primary Care Physician:  Rennie PlowmanMargaret Arnett, FNP Primary Gastroenterologist:  Dr. Wyline MoodKiran Ascencion Stegner  Reason for Consultation:     Abdominal gas        HPI:   Brandi PasseDarth Mathis is a 60 y.o. y/o female referred for consultation & management  by Dr. Rennie PlowmanMargaret Arnett, FNP.    She was recently seen by her PCP on 04/05/16 for right sided gas pain . Tried Prilosec which has not helped. She is a diabetic and her last Hba1c is 9.7 . She has been referred here for an EGD+colonoscopy . H pylori antibody is negative. Last CBC in 05/2015 had an elevated eosinophil count and platelet count . RUQ usg on 04/12/2016 shows hepatic steatosis . She is on metformin .  She says she has been passing a lot of has since last 1 year, gradually getting worse, Feels something "sits in her chest " , after meals, No issues swallowing . Denies bloating or distension , passes a lot of foul smelling gas, when she doesn't pass the gas feel uncomfortable. The discomfort in her chest relives after she passes gas. No change of weight . She has a bowel movement  More on some days and can have many days when she doesn't have a bowel movement , following which she has a bowel movement which is soft . If she doesn't have a bowel movement has chest discomfort.Has been on metformin for 20 years.   She consumes Splenda - uses in her coffee , oatmeal  . She consumes diet soda few times a week. She also consumes crystalite twice a week . Chews gum once a week . She also feels that milk bothers her and causes the "gas pain ".   She had her last colonoscopy 10 years back . She has blood on the toilet paper when she passes hard stool. Her stool is thinner in caliber than usual. One of her brother has polyps/   She recalls all her symptoms began at the same time she had worsening of her glycemic control.    Past Medical History:  Diagnosis Date  . Arthritis   . Diabetes  mellitus without complication (HCC)   . Hyperlipidemia   . Hypertension     Past Surgical History:  Procedure Laterality Date  . ABDOMINAL HYSTERECTOMY  2003   unsure if ovaries still there    Prior to Admission medications   Medication Sig Start Date End Date Taking? Authorizing Provider  amitriptyline (ELAVIL) 10 MG tablet Take 1-3 tablets at night by mouth 06/22/15  Yes Carollee Leitzarrie M Doss, NP  atorvastatin (LIPITOR) 20 MG tablet Take 1 tablet (20 mg total) by mouth daily. 05/13/15  Yes Carollee Leitzarrie M Doss, NP  Coenzyme Q10 (CO Q 10) 10 MG CAPS Take 200 mg by mouth daily.    Yes Historical Provider, MD  glimepiride (AMARYL) 2 MG tablet Take by mouth. 01/25/16  Yes Historical Provider, MD  losartan-hydrochlorothiazide (HYZAAR) 100-25 MG tablet Take 1 tablet by mouth daily. 05/13/15  Yes Carollee Leitzarrie M Doss, NP  metFORMIN (GLUCOPHAGE) 1000 MG tablet TAKE 1 TABLET TWICE A DAY 09/19/15  Yes Carollee Leitzarrie M Doss, NP  omeprazole (PRILOSEC) 40 MG capsule Take 1 capsule (40 mg total) by mouth daily. 04/08/15  Yes Carollee Leitzarrie M Doss, NP    Family History  Problem Relation Age of Onset  . Hypertension Mother      Social History  Substance Use Topics  .  Smoking status: Never Smoker  . Smokeless tobacco: Never Used  . Alcohol use No    Allergies as of 04/17/2016  . (No Known Allergies)    Review of Systems:    All systems reviewed and negative except where noted in HPI.   Physical Exam:  BP (!) 151/94   Pulse (!) 109   Temp 98.5 F (36.9 C) (Oral)   Ht 5\' 4"  (1.626 m)   Wt 174 lb (78.9 kg)   BMI 29.87 kg/m  No LMP recorded. Psych:  Alert and cooperative. Normal mood and affect. General:   Alert,  Well-developed, well-nourished, pleasant and cooperative in NAD Head:  Normocephalic and atraumatic. Eyes:  Sclera clear, no icterus.   Conjunctiva pink. Ears:  Normal auditory acuity. Nose:  No deformity, discharge, or lesions. Mouth:  No deformity or lesions,oropharynx pink & moist. Neck:  Supple; no masses  or thyromegaly. Lungs:  Respirations even and unlabored.  Clear throughout to auscultation.   No wheezes, crackles, or rhonchi. No acute distress. Heart:  Regular rate and rhythm; no murmurs, clicks, rubs, or gallops. Abdomen:  Normal bowel sounds.  No bruits.  Soft, non-tender and non-distended without masses, hepatosplenomegaly or hernias noted.  No guarding or rebound tenderness.    Msk:  Symmetrical without gross deformities. Good, equal movement & strength bilaterally. Pulses:  Normal pulses noted. Extremities:  No clubbing or edema.  No cyanosis. Neurologic:  Alert and oriented x3;  grossly normal neurologically. Skin:  Intact without significant lesions or rashes. No jaundice. Lymph Nodes:  No significant cervical adenopathy. Psych:  Alert and cooperative. Normal mood and affect.  Imaging Studies: [place] Abdomen Limited Ruq  Result Date: 04/12/2016 CLINICAL DATA:  Right-sided abdominal pain EXAM: US ABDOMEN LIMITED - RIGHT UPPER QUADRANT COMPARISON:  None. FINDINGS: Gallbladder: No gallstones or wall thickening visualized. There is no pericholecystic fluid. No sonographic Murphy sign noted by sonographer. Common bile duct: Diameter: 2 mm. There is no intrahepatic or extrahepatic biliary duct dilatation. Liver: No focal lesion identified. Liver echogenicity is increased diffusely. IMPRESSION: Diffuse increase in liver echogenicity, a finding most likely due to hepatic steatosis. While no focal liver lesions are identified, it must be cautioned that the sensitivity of ultrasound for focal liver lesions is diminished significantly in this circumstance. Study otherwise unremarkable. Electronically Signed   By: Bretta BangWilliam  Woodruff III M.D.   On: 04/12/2016 09:13    Assessment and Plan:   Brandi PasseDarth Mathis is a 60 y.o. y/o female has been referred for abdominal gas issues, rectal bleeding , change in bowel habits. Her symptoms are very suggestive of gas production due to excess consumption of artificial  sugars, rectal bleeding likely related to hemorrhoids. Poor glycemic control may also be affecting her GI motility.   Plan  1. Stop/limit use of artificial sugars 2. EGD+colonoscopy 3. Colace for hard stool  4. Better glycemic control    I have discussed risks & benefits of the procedure  which include, but are not limited to, bleeding, infection, perforation,respiratory compromise & drug reaction.  The patient agrees with this plan & written consent will be obtained.    Follow up in 6 weeks   Dr Wyline MoodKiran Makailah Slavick MD

## 2016-04-17 NOTE — Patient Instructions (Addendum)
I will be calling you to scheduled your colonoscopy, upper endoscopy, and you follow up appointment   1. Advised to stop or limit use of artificial sugars.     Constipation, Adult Constipation is when a person:  Poops (has a bowel movement) less than 3 times a week.  Has a hard time pooping.  Has poop that is dry, hard, or bigger than normal. HOME CARE   Eat foods with a lot of fiber in them. This includes fruits, vegetables, beans, and whole grains such as brown rice.  Avoid fatty foods and foods with a lot of sugar. This includes french fries, hamburgers, cookies, candy, and soda.  If you are not getting enough fiber from food, take products with added fiber in them (supplements).  Drink enough fluid to keep your pee (urine) clear or pale yellow.  Exercise on a regular basis, or as told by your doctor.  Go to the restroom when you feel like you need to poop. Do not hold it.  Only take medicine as told by your doctor. Do not take medicines that help you poop (laxatives) without talking to your doctor first. GET HELP RIGHT AWAY IF:   You have bright red blood in your poop (stool).  Your constipation lasts more than 4 days or gets worse.  You have belly (abdominal) or butt (rectal) pain.  You have thin poop (as thin as a pencil).  You lose weight, and it cannot be explained. MAKE SURE YOU:   Understand these instructions.  Will watch your condition.  Will get help right away if you are not doing well or get worse.   This information is not intended to replace advice given to you by your health care provider. Make sure you discuss any questions you have with your health care provider.   Document Released: 11/14/2007 Document Revised: 06/18/2014 Document Reviewed: 03/09/2013 Elsevier Interactive Patient Education 2016 [employer] Hemorrhoids Hemorrhoids are swollen veins around the rectum or anus. There are two types of hemorrhoids:   Internal hemorrhoids. These  occur in the veins just inside the rectum. They may poke through to the outside and become irritated and painful.  External hemorrhoids. These occur in the veins outside the anus and can be felt as a painful swelling or hard lump near the anus. CAUSES  Pregnancy.   Obesity.   Constipation or diarrhea.   Straining to have a bowel movement.   Sitting for long periods on the toilet.  Heavy lifting or other activity that caused you to strain.  Anal intercourse. SYMPTOMS   Pain.   Anal itching or irritation.   Rectal bleeding.   Fecal leakage.   Anal swelling.   One or more lumps around the anus.  DIAGNOSIS  Your caregiver may be able to diagnose hemorrhoids by visual examination. Other examinations or tests that may be performed include:   Examination of the rectal area with a gloved hand (digital rectal exam).   Examination of anal canal using a small tube (scope).   A blood test if you have lost a significant amount of blood.  A test to look inside the colon (sigmoidoscopy or colonoscopy). TREATMENT Most hemorrhoids can be treated at home. However, if symptoms do not seem to be getting better or if you have a lot of rectal bleeding, your caregiver may perform a procedure to help make the hemorrhoids get smaller or remove them completely. Possible treatments include:   Placing a rubber band at the base of the  hemorrhoid to cut off the circulation (rubber band ligation).   Injecting a chemical to shrink the hemorrhoid (sclerotherapy).   Using a tool to burn the hemorrhoid (infrared light therapy).   Surgically removing the hemorrhoid (hemorrhoidectomy).   Stapling the hemorrhoid to block blood flow to the tissue (hemorrhoid stapling).  HOME CARE INSTRUCTIONS   Eat foods with fiber, such as whole grains, beans, nuts, fruits, and vegetables. Ask your doctor about taking products with added fiber in them (fibersupplements).  Increase fluid intake.  Drink enough water and fluids to keep your urine clear or pale yellow.   Exercise regularly.   Go to the bathroom when you have the urge to have a bowel movement. Do not wait.   Avoid straining to have bowel movements.   Keep the anal area dry and clean. Use wet toilet paper or moist towelettes after a bowel movement.   Medicated creams and suppositories may be used or applied as directed.   Only take over-the-counter or prescription medicines as directed by your caregiver.   Take warm sitz baths for 15-20 minutes, 3-4 times a day to ease pain and discomfort.   Place ice packs on the hemorrhoids if they are tender and swollen. Using ice packs between sitz baths may be helpful.   Put ice in a plastic bag.   Place a towel between your skin and the bag.   Leave the ice on for 15-20 minutes, 3-4 times a day.   Do not use a donut-shaped pillow or sit on the toilet for long periods. This increases blood pooling and pain.  SEEK MEDICAL CARE IF:  You have increasing pain and swelling that is not controlled by treatment or medicine.  You have uncontrolled bleeding.  You have difficulty or you are unable to have a bowel movement.  You have pain or inflammation outside the area of the hemorrhoids. MAKE SURE YOU:  Understand these instructions.  Will watch your condition.  Will get help right away if you are not doing well or get worse.   This information is not intended to replace advice given to you by your health care provider. Make sure you discuss any questions you have with your health care provider.   Document Released: 05/25/2000 Document Revised: 05/14/2012 Document Reviewed: 04/01/2012 Elsevier Interactive Patient Education [employer]2016 Elsevier Inc.

## 2016-04-18 ENCOUNTER — Telehealth: Payer: Self-pay | Admitting: *Deleted

## 2016-04-18 ENCOUNTER — Encounter: Payer: Self-pay | Admitting: Family

## 2016-04-18 DIAGNOSIS — K760 Fatty (change of) liver, not elsewhere classified: Secondary | ICD-10-CM | POA: Insufficient documentation

## 2016-04-18 NOTE — Telephone Encounter (Signed)
Advised pt she may purchase stool softeners otc as her mail order pharmacy will not fill them.

## 2016-04-18 NOTE — Telephone Encounter (Signed)
Patient called and states that Dr. Tobi BastosAnna prescribed her stool softeners. Her pharmacy [facility] will not fill them.  She is wondering can she get those over the counter. Patient is requesting a call back. Her number is [phone]. Please advise . Thank you

## 2016-04-25 ENCOUNTER — Encounter: Payer: Self-pay | Admitting: *Deleted

## 2016-04-26 ENCOUNTER — Ambulatory Visit
Admission: RE | Admit: 2016-04-26 | Discharge: 2016-04-26 | Disposition: A | Discharge status: Home / Self Care | Payer: BLUE CROSS/BLUE SHIELD | Source: Ambulatory Visit | Attending: Gastroenterology | Admitting: Gastroenterology

## 2016-04-26 ENCOUNTER — Ambulatory Visit: Payer: BLUE CROSS/BLUE SHIELD | Admitting: Certified Registered"

## 2016-04-26 ENCOUNTER — Encounter
Admission: RE | Disposition: A | Discharge status: Home / Self Care | Payer: Self-pay | Source: Ambulatory Visit | Attending: Gastroenterology

## 2016-04-26 DIAGNOSIS — K625 Hemorrhage of anus and rectum: Secondary | ICD-10-CM

## 2016-04-26 DIAGNOSIS — K640 First degree hemorrhoids: Secondary | ICD-10-CM

## 2016-04-26 DIAGNOSIS — M1990 Unspecified osteoarthritis, unspecified site: Secondary | ICD-10-CM | POA: Diagnosis not present

## 2016-04-26 DIAGNOSIS — G4730 Sleep apnea, unspecified: Secondary | ICD-10-CM | POA: Diagnosis not present

## 2016-04-26 DIAGNOSIS — R1013 Epigastric pain: Secondary | ICD-10-CM | POA: Diagnosis not present

## 2016-04-26 DIAGNOSIS — E119 Type 2 diabetes mellitus without complications: Secondary | ICD-10-CM | POA: Diagnosis not present

## 2016-04-26 DIAGNOSIS — Z79899 Other long term (current) drug therapy: Secondary | ICD-10-CM | POA: Diagnosis not present

## 2016-04-26 DIAGNOSIS — K219 Gastro-esophageal reflux disease without esophagitis: Secondary | ICD-10-CM | POA: Insufficient documentation

## 2016-04-26 DIAGNOSIS — I10 Essential (primary) hypertension: Secondary | ICD-10-CM | POA: Diagnosis not present

## 2016-04-26 DIAGNOSIS — J449 Chronic obstructive pulmonary disease, unspecified: Secondary | ICD-10-CM | POA: Insufficient documentation

## 2016-04-26 DIAGNOSIS — Z7984 Long term (current) use of oral hypoglycemic drugs: Secondary | ICD-10-CM | POA: Insufficient documentation

## 2016-04-26 DIAGNOSIS — E785 Hyperlipidemia, unspecified: Secondary | ICD-10-CM | POA: Diagnosis not present

## 2016-04-26 DIAGNOSIS — Z905 Acquired absence of kidney: Secondary | ICD-10-CM | POA: Diagnosis not present

## 2016-04-26 HISTORY — PX: COLONOSCOPY WITH PROPOFOL: SHX5780

## 2016-04-26 HISTORY — PX: ESOPHAGOGASTRODUODENOSCOPY (EGD) WITH PROPOFOL: SHX5813

## 2016-04-26 LAB — GLUCOSE, CAPILLARY: Glucose-Capillary: 196 mg/dL — ABNORMAL HIGH (ref 65–99)

## 2016-04-26 SURGERY — COLONOSCOPY WITH PROPOFOL
Anesthesia: General

## 2016-04-26 MED ORDER — PROPOFOL 500 MG/50ML IV EMUL
INTRAVENOUS | Status: DC | PRN
  Administered 2016-04-26: 150 ug/kg/min via INTRAVENOUS

## 2016-04-26 MED ORDER — MIDAZOLAM HCL 2 MG/2ML IJ SOLN
INTRAMUSCULAR | Status: DC | PRN
  Administered 2016-04-26 (×2): 2 mg via INTRAVENOUS

## 2016-04-26 MED ORDER — PROPOFOL 10 MG/ML IV BOLUS
INTRAVENOUS | Status: DC | PRN
  Administered 2016-04-26: 40 mg via INTRAVENOUS
  Administered 2016-04-26: 60 mg via INTRAVENOUS

## 2016-04-26 MED ORDER — SODIUM CHLORIDE 0.9 % IV SOLN
INTRAVENOUS | Status: DC
  Administered 2016-04-26: 10:00:00 via INTRAVENOUS

## 2016-04-26 MED ORDER — FENTANYL CITRATE (PF) 100 MCG/2ML IJ SOLN
INTRAMUSCULAR | Status: DC | PRN
  Administered 2016-04-26: 50 ug via INTRAVENOUS

## 2016-04-26 MED ORDER — GLYCOPYRROLATE 0.2 MG/ML IJ SOLN
INTRAMUSCULAR | Status: DC | PRN
  Administered 2016-04-26: 0.2 mg via INTRAVENOUS

## 2016-04-26 MED ORDER — LIDOCAINE HCL (CARDIAC) 20 MG/ML IV SOLN
INTRAVENOUS | Status: DC | PRN
  Administered 2016-04-26: 100 mg via INTRAVENOUS

## 2016-04-26 NOTE — OR Nursing (Signed)
Patient received multiple IV sticks due to very poor venous access.

## 2016-04-26 NOTE — Op Note (Signed)
[facility] Gastroenterology Patient Name: Brandi PasseDarth Goonan Procedure Date: 04/26/2016 10:07 AM MRN: [id number] Account #: [id number] Date of Birth: [birth year] Admit Type: Outpatient Age: 60 [facility] ENDO ROOM 4 Gender: Female Note Status: Finalized Procedure:            Upper GI endoscopy Indications:          Dyspepsia Providers:            Wyline MoodKiran Allisyn Kunz MD, MD Medicines:            Monitored Anesthesia Care Complications:        No immediate complications. Procedure:            Pre-Anesthesia Assessment:                       - ASA Grade Assessment: II - A patient with mild                        systemic disease.                       - Prior to the procedure, a History and Physical was                        performed, and patient medications, allergies and                        sensitivities were reviewed. The patient's tolerance of                        previous anesthesia was reviewed.                       - The risks and benefits of the procedure and the                        sedation options and risks were discussed with the                        patient. All questions were answered and informed                        consent was obtained.                       After obtaining informed consent, the endoscope was                        passed under direct vision. Throughout the procedure,                        the patient's blood pressure, pulse, and oxygen                        saturations were monitored continuously. The Endoscope                        was introduced through the mouth, and advanced to the                        third part of duodenum. The upper GI endoscopy was  accomplished with ease. The patient tolerated the                        procedure well. Findings:      The esophagus was normal.      The stomach was normal.      The examined duodenum was normal. Biopsies for histology were taken with       a  cold forceps for evaluation of celiac disease. Impression:           - Normal esophagus.                       - Normal stomach.                       - Normal examined duodenum. Biopsied. Recommendation:       - Await pathology results.                       - Patient has a contact number available for                        emergencies. The signs and symptoms of potential                        delayed complications were discussed with the patient.                        Return to normal activities tomorrow. Written discharge                        instructions were provided to the patient.                       - Resume previous diet.                       - Continue present medications.                       - Return to my office as previously scheduled. Procedure Code(s):    --- Professional ---                       [phone], Esophagogastroduodenoscopy, flexible, transoral;                        with biopsy, single or multiple Diagnosis Code(s):    --- Professional ---                       R10.13, Epigastric pain CPT copyright 2016 American Medical Association. All rights reserved. The codes documented in this report are preliminary and upon coder review may  be revised to meet current compliance requirements. Wyline MoodKiran Ronee Ranganathan, MD Wyline MoodKiran Drey Shaff MD, MD 04/26/2016 10:28:47 AM This report has been signed electronically. Number of Addenda: 0 Note Initiated On: 04/26/2016 10:07 AM      [facility]

## 2016-04-26 NOTE — Anesthesia Postprocedure Evaluation (Signed)
Anesthesia Post Note  Patient: Keiley Muscato  Procedure(s) Performed: Procedure(s) (LRB): COLONOSCOPY WITH PROPOFOL (N/A) ESOPHAGOGASTRODUODENOSCOPY (EGD) WITH PROPOFOL (N/A)  Patient location during evaluation: Endoscopy Anesthesia Type: General Level of consciousness: awake and alert and oriented Pain management: pain level controlled Vital Signs Assessment: post-procedure vital signs reviewed and stable Respiratory status: spontaneous breathing, nonlabored ventilation and respiratory function stable Cardiovascular status: blood pressure returned to baseline and stable Postop Assessment: no signs of nausea or vomiting Anesthetic complications: no    Last Vitals:  Vitals:   04/26/16 1118 04/26/16 1128  BP: (!) 121/98 (!) 128/97  Pulse: (!) 106 (!) 101  Resp: 16 14  Temp:      Last Pain:  Vitals:   04/26/16 1058  TempSrc: Tympanic                 Spike Desilets

## 2016-04-26 NOTE — Anesthesia Preprocedure Evaluation (Signed)
Anesthesia Evaluation  Patient identified by MRN, date of birth, ID band Patient awake    Reviewed: Allergy & Precautions, NPO status , Patient's Chart, lab work & pertinent test results  History of Anesthesia Complications Negative for: history of anesthetic complications  Airway Mallampati: II  TM Distance: >3 FB Neck ROM: Full    Dental no notable dental hx.    Pulmonary sleep apnea , neg COPD,    breath sounds clear to auscultation- rhonchi (-) wheezing      Cardiovascular Exercise Tolerance: Good hypertension, Pt. on medications (-) CAD and (-) Past MI  Rhythm:Regular Rate:Normal - Systolic murmurs and - Diastolic murmurs    Neuro/Psych negative neurological ROS  negative psych ROS   GI/Hepatic Neg liver ROS, GERD  ,  Endo/Other  diabetes, Type 2, Oral Hypoglycemic Agents  Renal/GU negative Renal ROS     Musculoskeletal  (+) Arthritis ,   Abdominal (+) - obese,   Peds  Hematology   Anesthesia Other Findings Past Medical History: No date: Arthritis No date: Diabetes mellitus without complication (HCC) No date: Hyperlipidemia No date: Hypertension   Reproductive/Obstetrics                             Anesthesia Physical Anesthesia Plan  ASA: II  Anesthesia Plan: General   Post-op Pain Management:    Induction: Intravenous  Airway Management Planned: Natural Airway  Additional Equipment:   Intra-op Plan:   Post-operative Plan:   Informed Consent: I have reviewed the patients History and Physical, chart, labs and discussed the procedure including the risks, benefits and alternatives for the proposed anesthesia with the patient or authorized representative who has indicated his/her understanding and acceptance.   Dental advisory given  Plan Discussed with: CRNA and Anesthesiologist  Anesthesia Plan Comments:         Anesthesia Quick Evaluation

## 2016-04-26 NOTE — Transfer of Care (Signed)
Immediate Anesthesia Transfer of Care Note  Patient: Brandi Mathis  Procedure(s) Performed: Procedure(s): COLONOSCOPY WITH PROPOFOL (N/A) ESOPHAGOGASTRODUODENOSCOPY (EGD) WITH PROPOFOL (N/A)  Patient Location: PACU  Anesthesia Type:General  Level of Consciousness: sedated and responds to stimulation  Airway & Oxygen Therapy: Patient Spontanous Breathing and Patient connected to nasal cannula oxygen  Post-op Assessment: Report given to RN and Post -op Vital signs reviewed and stable  Post vital signs: Reviewed and stable  Last Vitals:  Vitals:   04/26/16 0933 04/26/16 1058  BP: (!) 155/93 118/85  Pulse: (!) 115 (!) 108  Resp: 16 12  Temp: 36.4 C     Last Pain:  Vitals:   04/26/16 0933  TempSrc: Tympanic         Complications: No apparent anesthesia complications

## 2016-04-26 NOTE — Anesthesia Procedure Notes (Addendum)
Performed by: Keelen Quevedo Pre-anesthesia Checklist: Patient identified, Emergency Drugs available, Suction available, Patient being monitored and Timeout performed Patient Re-evaluated:Patient Re-evaluated prior to inductionOxygen Delivery Method: Nasal cannula Preoxygenation: Pre-oxygenation with 100% oxygen Intubation Type: IV induction Ventilation: Nasal airway inserted- appropriate to patient size        

## 2016-04-26 NOTE — Op Note (Signed)
[facility] Gastroenterology Patient Name: Nissa Stannard Procedure Date: 04/26/2016 10:06 AM MRN: [id number] Account #: [id number] Date of Birth: [birth year] Admit Type: Outpatient Age: 60 Room: [facility] ENDO ROOM 4 Gender: Female Note Status: Finalized Procedure:            Colonoscopy Indications:          Rectal bleeding Providers:            Wyline Mood MD, MD Referring MD:         No Local Md, MD (Referring MD) Medicines:            Monitored Anesthesia Care Complications:        No immediate complications. Procedure:            Pre-Anesthesia Assessment:                       - ASA Grade Assessment: II - A patient with mild                        systemic disease.                       - Prior to the procedure, a History and Physical was                        performed, and patient medications, allergies and                        sensitivities were reviewed. The patient's tolerance of                        previous anesthesia was reviewed.                       - The risks and benefits of the procedure and the                        sedation options and risks were discussed with the                        patient. All questions were answered and informed                        consent was obtained.                       After obtaining informed consent, the colonoscope was                        passed under direct vision. Throughout the procedure,                        the patient's blood pressure, pulse, and oxygen                        saturations were monitored continuously. The                        Colonoscope was introduced through the anus and                        advanced  to the the cecum, identified by the                        appendiceal orifice, IC valve and transillumination.                        The colonoscopy was performed with ease. The patient                        tolerated the procedure well. The quality of the bowel              preparation was good. Findings:      The perianal and digital rectal examinations were normal.      Non-bleeding internal hemorrhoids were found during retroflexion. The       hemorrhoids were large and Grade I (internal hemorrhoids that do not       prolapse).      The entire examined colon appeared normal. Impression:           - Non-bleeding internal hemorrhoids.                       - The entire examined colon is normal.                       - No specimens collected. Recommendation:       - Patient has a contact number available for                        emergencies. The signs and symptoms of potential                        delayed complications were discussed with the patient.                        Return to normal activities tomorrow. Written discharge                        instructions were provided to the patient.                       - Resume previous diet.                       - Discharge patient to home (with escort).                       - Repeat colonoscopy in 10 years for screening purposes.                       - Return to GI office as previously scheduled. Procedure Code(s):    --- Professional ---                       [phone], Colonoscopy, flexible; diagnostic, including                        collection of specimen(s) by brushing or washing, when                        performed (separate procedure) Diagnosis Code(s):    --- Professional ---  K64.0, First degree hemorrhoids                       K62.5, Hemorrhage of anus and rectum CPT copyright 2016 American Medical Association. All rights reserved. The codes documented in this report are preliminary and upon coder review may  be revised to meet current compliance requirements. Wyline MoodKiran Naija Troost, MD Wyline MoodKiran Melayah Skorupski MD, MD 04/26/2016 10:56:35 AM This report has been signed electronically. Number of Addenda: 0 Note Initiated On: 04/26/2016 10:06 AM Scope Withdrawal Time: 0 hours 12  minutes 43 seconds  Total Procedure Duration: 0 hours 19 minutes 38 seconds       [facility]

## 2016-04-26 NOTE — H&P (Signed)
Brandi MoodKiran Ena Demary MD [address] [place], [place] [address] Phone: [phone] Fax : [phone]  Primary Care Physician:  Rennie PlowmanMargaret Arnett, FNP Primary Gastroenterologist:  Dr. Wyline MoodKiran Alamin [place]   Pre-Procedure History & Physical: HPI:  Brandi Mathis is a 60 y.o. female is here for an endoscopy and colonoscopy.   Past Medical History:  Diagnosis Date  . Arthritis   . Diabetes mellitus without complication (HCC)   . Hyperlipidemia   . Hypertension     Past Surgical History:  Procedure Laterality Date  . ABDOMINAL HYSTERECTOMY  2003   unsure if ovaries still there  . left nephrectomy      Prior to Admission medications   Medication Sig Start Date End Date Taking? Authorizing Provider  amitriptyline (ELAVIL) 10 MG tablet Take 1-3 tablets at night by mouth 06/22/15  Yes Carollee Leitzarrie M Doss, NP  atorvastatin (LIPITOR) 20 MG tablet Take 1 tablet (20 mg total) by mouth daily. 05/13/15  Yes Carollee Leitzarrie M Doss, NP  Coenzyme Q10 (CO Q 10) 10 MG CAPS Take 200 mg by mouth daily.    Yes Historical Provider, MD  docusate sodium (COLACE) 100 MG capsule Take 1 capsule (100 mg total) by mouth 2 (two) times daily. 04/17/16 05/17/16 Yes Brandi MoodKiran Redina Zeller, MD  glimepiride (AMARYL) 2 MG tablet Take by mouth. 01/25/16  Yes Historical Provider, MD  losartan-hydrochlorothiazide (HYZAAR) 100-25 MG tablet Take 1 tablet by mouth daily. 05/13/15  Yes Carollee Leitzarrie M Doss, NP  metFORMIN (GLUCOPHAGE) 1000 MG tablet TAKE 1 TABLET TWICE A DAY 09/19/15  Yes Carollee Leitzarrie M Doss, NP  omeprazole (PRILOSEC) 40 MG capsule Take 1 capsule (40 mg total) by mouth daily. 04/08/15  Yes Carollee Leitzarrie M Doss, NP    Allergies as of 04/17/2016  . (No Known Allergies)    Family History  Problem Relation Age of Onset  . Hypertension Mother     Social History   Social History  . Marital status: Single    Spouse name: N/A  . Number of children: N/A  . Years of education: N/A   Occupational History  . Not on file.   Social History Main Topics  . Smoking  status: Never Smoker  . Smokeless tobacco: Never Used  . Alcohol use No  . Drug use: No  . Sexual activity: No   Other Topics Concern  . Not on file   Social History Narrative   Retired   [employer]    Children- 1    Caffeine- Coffee and diet soda- 1/2 cup of soda and 1 cup coffee        Review of Systems: See HPI, otherwise negative ROS  Physical Exam: BP (!) 155/93   Pulse (!) 115   Temp 97.6 F (36.4 C) (Tympanic)   Resp 16   Ht 5\' 4"  (1.626 m)   Wt 172 lb (78 kg)   SpO2 97%   BMI 29.52 kg/m  General:   Alert,  pleasant and cooperative in NAD Head:  Normocephalic and atraumatic. Neck:  Supple; no masses or thyromegaly. Lungs:  Clear throughout to auscultation.    Heart:  Regular rate and rhythm. Abdomen:  Soft, nontender and nondistended. Normal bowel sounds, without guarding, and without rebound.   Neurologic:  Alert and  oriented x4;  grossly normal neurologically.  Impression/Plan: Brandi Mathis is here for an endoscopy and colonoscopy to be performed for change in bowel habits, gas and rectal bleeding. Since she stopped consuming artificial sugars she has seen a significant improvement.  Risks, benefits, limitations, and alternatives regarding  endoscopy and colonoscopy have been reviewed with the patient.  Questions have been answered.  All parties agreeable.   Brandi MoodKiran Pixie Burgener, MD  04/26/2016, 9:43 AM

## 2016-04-27 LAB — SURGICAL PATHOLOGY

## 2016-05-07 ENCOUNTER — Encounter: Payer: Self-pay | Admitting: Family

## 2016-05-07 ENCOUNTER — Telehealth: Payer: Self-pay | Admitting: *Deleted

## 2016-05-07 ENCOUNTER — Ambulatory Visit (INDEPENDENT_AMBULATORY_CARE_PROVIDER_SITE_OTHER): Payer: BLUE CROSS/BLUE SHIELD | Admitting: Family

## 2016-05-07 VITALS — BP 122/90 | HR 99 | Temp 98.0°F | Ht 64.0 in | Wt 173.8 lb

## 2016-05-07 DIAGNOSIS — R0602 Shortness of breath: Secondary | ICD-10-CM | POA: Insufficient documentation

## 2016-05-07 DIAGNOSIS — M25511 Pain in right shoulder: Secondary | ICD-10-CM | POA: Diagnosis not present

## 2016-05-07 DIAGNOSIS — G4733 Obstructive sleep apnea (adult) (pediatric): Secondary | ICD-10-CM

## 2016-05-07 DIAGNOSIS — Z1239 Encounter for other screening for malignant neoplasm of breast: Secondary | ICD-10-CM

## 2016-05-07 DIAGNOSIS — E785 Hyperlipidemia, unspecified: Secondary | ICD-10-CM

## 2016-05-07 DIAGNOSIS — Z1231 Encounter for screening mammogram for malignant neoplasm of breast: Secondary | ICD-10-CM

## 2016-05-07 DIAGNOSIS — K219 Gastro-esophageal reflux disease without esophagitis: Secondary | ICD-10-CM

## 2016-05-07 DIAGNOSIS — I10 Essential (primary) hypertension: Secondary | ICD-10-CM

## 2016-05-07 DIAGNOSIS — K760 Fatty (change of) liver, not elsewhere classified: Secondary | ICD-10-CM

## 2016-05-07 DIAGNOSIS — R0789 Other chest pain: Secondary | ICD-10-CM | POA: Diagnosis not present

## 2016-05-07 MED ORDER — DICLOFENAC SODIUM 50 MG PO TBEC: 50.0000 mg | DELAYED_RELEASE_TABLET | Freq: Every day | ORAL | 1 refills | Status: AC | PRN

## 2016-05-07 NOTE — Assessment & Plan Note (Addendum)
No SOB today. Sao2 97. Suspect deconditioning. However in context of nonspecific nature of chest wall pain, pending referral to cardiology.

## 2016-05-07 NOTE — Assessment & Plan Note (Signed)
Worsening. Reassured by normal EGD, colonoscopy. Concerned that right sided chest wall pain is breast in origin versus GERD. Will continue to follow and discuss trialing different agent at f/u.

## 2016-05-07 NOTE — Assessment & Plan Note (Signed)
No cipap yet. Awaiting appt.

## 2016-05-07 NOTE — Assessment & Plan Note (Addendum)
Pain over right breast. Due for mammogram and ordered. Alternately considering OA of right shoulder as pain is exacerbated by activity using arms. Trial of diclofenac as needed for pain.  Right sided chest wall pain has been going on for years, unlikely to be ACS however due to patient co morbidities and family history, we agreed consult to cardiology for likely another stress test would be reasonable. No acute cardiac symptoms today.

## 2016-05-07 NOTE — Progress Notes (Signed)
Subjective:    Patient ID: Brandi Mathis, female    DOB: 02/01/1956, 60 y.o.   MRN: [id number]  CC: Brandi Mathis is a 60 y.o. female who presents today for follow up.   HPI: Here for follow up of right sided chest pain. Pain radiates down right arm. Worsening. Wandering if 'pulled muscle' when helping friend paint. No numbness, tingling. Worsened with eating, doesn't matter what type of food. Has been hanging Christmas decorations and worsening. Used to be able to tell Prilosec was working and now doesn't do anything. Epigastric pain after big meal.   Also notes occasional SOB over the past 2 months, unchanged. Describes as 'short episode' a couple of times of day. Not related to exercise.  Had stress test 4 years in [place] and patient states normal ( no records of this). Had this test done due to right sided chest wall pain.   Denies exertional chest pain or pressure, numbness or tingling radiating to left arm or jaw, palpitations, dizziness, frequent headaches, changes in vision. No SOB today. No exercise.    Since last visit she has been seen by GI and had EGD ( normal 04/2015) and colonoscopy ( normal, non bleeding hemorrhoids , 04/2016). RUQ 04/2016 showed fatty liver, no focal infiltrate. Neg h pylori. Normal AST, ALT.   LDL 109 12/2015; on lipitor and cannot tolerate increase  GERD- Worsening. prilosec 40 mg BID.   No h/o smoking. No h/o blood clots.        HISTORY:  Past Medical History:  Diagnosis Date  . Arthritis   . Diabetes mellitus without complication (HCC)   . Hyperlipidemia   . Hypertension    Past Surgical History:  Procedure Laterality Date  . ABDOMINAL HYSTERECTOMY  2003   unsure if ovaries still there  . COLONOSCOPY WITH PROPOFOL N/A 04/26/2016   Procedure: COLONOSCOPY WITH PROPOFOL;  Surgeon: Wyline Mood, MD;  Location: [facility] ENDOSCOPY;  Service: Endoscopy;  Laterality: N/A;  . ESOPHAGOGASTRODUODENOSCOPY (EGD) WITH PROPOFOL N/A 04/26/2016   Procedure:  ESOPHAGOGASTRODUODENOSCOPY (EGD) WITH PROPOFOL;  Surgeon: Wyline Mood, MD;  Location: [facility] ENDOSCOPY;  Service: Endoscopy;  Laterality: N/A;  . left nephrectomy     Family History  Problem Relation Age of Onset  . Hypertension Mother   . CAD Other     multiple relatives with stents    Allergies: Patient has no known allergies. Current Outpatient Prescriptions on File Prior to Visit  Medication Sig Dispense Refill  . amitriptyline (ELAVIL) 10 MG tablet Take 1-3 tablets at night by mouth 270 tablet 1  . atorvastatin (LIPITOR) 20 MG tablet Take 1 tablet (20 mg total) by mouth daily. 90 tablet 1  . Coenzyme Q10 (CO Q 10) 10 MG CAPS Take 200 mg by mouth daily.     Marland Kitchen docusate sodium (COLACE) 100 MG capsule Take 1 capsule (100 mg total) by mouth 2 (two) times daily. 60 capsule 0  . glimepiride (AMARYL) 2 MG tablet Take by mouth.    . losartan-hydrochlorothiazide (HYZAAR) 100-25 MG tablet Take 1 tablet by mouth daily. 90 tablet 1  . metFORMIN (GLUCOPHAGE) 1000 MG tablet TAKE 1 TABLET TWICE A DAY 180 tablet 1  . omeprazole (PRILOSEC) 40 MG capsule Take 1 capsule (40 mg total) by mouth daily. 30 capsule 0   No current facility-administered medications on file prior to visit.     Social History  Substance Use Topics  . Smoking status: Never Smoker  . Smokeless tobacco: Never Used  . Alcohol use  No    Review of Systems  Constitutional: Negative for chills and fever.  Respiratory: Positive for shortness of breath. Negative for cough, chest tightness and wheezing.   Cardiovascular: Negative for chest pain, palpitations and leg swelling.  Gastrointestinal: Negative for abdominal pain, nausea and vomiting.  Neurological: Negative for headaches.      Objective:    BP 122/90   Pulse 99   Temp 98 F (36.7 C) (Oral)   Ht 5\' 4"  (1.626 m)   Wt 173 lb 12.8 oz (78.8 kg)   SpO2 97%   BMI 29.83 kg/m  BP Readings from Last 3 Encounters:  05/07/16 122/90  04/26/16 (!) 128/97  04/17/16 (!)  151/94   Wt Readings from Last 3 Encounters:  05/07/16 173 lb 12.8 oz (78.8 kg)  04/26/16 172 lb (78 kg)  04/17/16 174 lb (78.9 kg)    Physical Exam  Constitutional: She appears well-developed and well-nourished.  Eyes: Conjunctivae are normal.  Cardiovascular: Normal rate, regular rhythm, normal heart sounds and normal pulses.   Pulmonary/Chest: Effort normal and breath sounds normal. She has no wheezes. She has no rhonchi. She has no rales. She exhibits no mass, no tenderness and no bony tenderness. Right breast exhibits tenderness. Right breast exhibits no inverted nipple, no mass, no nipple discharge and no skin change. Left breast exhibits no inverted nipple, no mass, no nipple discharge, no skin change and no tenderness. Breasts are symmetrical.    Area of tenderness noting during CBE. No masses.   Musculoskeletal:       Right shoulder: She exhibits normal range of motion, no tenderness, no bony tenderness, no pain and no spasm.  No pain with palpation of shoulder joint.   Neurological: She is alert.  Skin: Skin is warm and dry.  Psychiatric: She has a normal mood and affect. Her speech is normal and behavior is normal. Thought content normal.  Vitals reviewed.      Assessment & Plan:   Problem List Items Addressed This Visit      Cardiovascular and Mediastinum   Benign essential HTN    At goal. Continue current regimen.         Respiratory   OSA (obstructive sleep apnea)    No cipap yet. Awaiting appt.        Digestive   Gastroesophageal reflux disease    Worsening. Reassured by normal EGD, colonoscopy. Concerned that right sided chest wall pain is breast in origin versus GERD. Will continue to follow and discuss trialing different agent at f/u.       Fatty liver    No focal infiltrate. Discussed fatty liver lifestyle modifications including low trans fat diet, maintaining a healthy weight. Emphasized importance of keeping cholesterol, BP, and A1c at goal. Will  continue to follow.         Other   Right-sided chest wall pain - Primary    Pain over right breast. Due for mammogram and ordered. Alternately considering OA of right shoulder as pain is exacerbated by activity using arms. Trial of diclofenac as needed for pain.  Right sided chest wall pain has been going on for years, unlikely to be ACS however due to patient co morbidities and family history, we agreed consult to cardiology for likely another stress test would be reasonable. No acute cardiac symptoms today.       Relevant Medications   diclofenac (VOLTAREN) 50 MG EC tablet   Other Relevant Orders   MM DIGITAL SCREENING BILATERAL  Ambulatory referral to Cardiology   Hyperlipidemia    LDL 109. Not at goal < 90. Advised increase of lipitor; patient declined due to AE. Will follow at CPE.       SOB (shortness of breath)    No SOB today. Sao2 97. Suspect deconditioning. However in context of nonspecific nature of chest wall pain, pending referral to cardiology.        Other Visit Diagnoses    Screening for breast cancer       Relevant Orders   MM DIGITAL SCREENING BILATERAL   Acute pain of right shoulder       Relevant Medications   diclofenac (VOLTAREN) 50 MG EC tablet       I am having Ms. Gougeon start on diclofenac. I am also having her maintain her Co Q 10, omeprazole, atorvastatin, losartan-hydrochlorothiazide, amitriptyline, metFORMIN, glimepiride, and docusate sodium.   Meds ordered this encounter  Medications  . diclofenac (VOLTAREN) 50 MG EC tablet    Sig: Take 1 tablet (50 mg total) by mouth daily as needed.    Dispense:  90 tablet    Refill:  1    Order Specific Question:   Supervising Provider    Answer:   Sherlene ShamsULLO, TERESA L [[id number]]    Return precautions given.   Risks, benefits, and alternatives of the medications and treatment plan prescribed today were discussed, and patient expressed understanding.   Education regarding symptom management and diagnosis  given to patient on AVS.  Continue to follow with Rennie PlowmanMargaret Arnett, FNP for routine health maintenance.   Pria Trembley and I agreed with plan.   Rennie PlowmanMargaret Arnett, FNP

## 2016-05-07 NOTE — Telephone Encounter (Signed)
Patient was seen in the office today, she stated that she picked up her Rx from the pharmacy for diclofenac, however she was requesting flexeril  Pt Contact [phone]

## 2016-05-07 NOTE — Assessment & Plan Note (Signed)
No focal infiltrate. Discussed fatty liver lifestyle modifications including low trans fat diet, maintaining a healthy weight. Emphasized importance of keeping cholesterol, BP, and A1c at goal. Will continue to follow.

## 2016-05-07 NOTE — Assessment & Plan Note (Signed)
At goal. Continue current regimen. 

## 2016-05-07 NOTE — Telephone Encounter (Signed)
Please advise 

## 2016-05-07 NOTE — Assessment & Plan Note (Signed)
LDL 109. Not at goal < 90. Advised increase of lipitor; patient declined due to AE. Will follow at CPE.

## 2016-05-07 NOTE — Progress Notes (Signed)
Pre visit review using our clinic review tool, if applicable. No additional management support is needed unless otherwise documented below in the visit note. 

## 2016-05-07 NOTE — Patient Instructions (Signed)
Suspect pain is due to breast tenderness.    We placed a referral. Mammogram this year. I asked that you call one the below locations and schedule this when it is convenient for you.   If you have dense breasts, you may ask for 3D mammogram over the traditional 2D mammogram as new evidence suggest 3D is superior. Please note that NOT all insurance companies cover 3D and you may have to pay a higher copay. You may call your insurance company to further clarify your benefits.   Options for Mammogram.    [facility]  [address]  [place], [place]  [phone]  * [facility]*   [facility] [address] [place], [place] [phone] * Note if you ask for 3D mammogram at this location, you must request [place], [place] location*

## 2016-05-08 NOTE — Telephone Encounter (Signed)
Patient thought she was receiving flexeril. Did not know what the Voltaren tab was. Patient understood and will t5ry medication.

## 2016-05-08 NOTE — Telephone Encounter (Signed)
Call pt-  What is flexeril for?  We had decided to trial voltaren PO for right chest wall pain.   Please clarify.

## 2016-05-10 ENCOUNTER — Ambulatory Visit: Payer: BLUE CROSS/BLUE SHIELD | Admitting: Family

## 2016-05-10 ENCOUNTER — Other Ambulatory Visit: Payer: Self-pay | Admitting: Family

## 2016-05-10 ENCOUNTER — Encounter: Payer: BLUE CROSS/BLUE SHIELD | Attending: Family | Admitting: Dietician

## 2016-05-10 VITALS — Ht 64.0 in | Wt 172.9 lb

## 2016-05-10 DIAGNOSIS — E119 Type 2 diabetes mellitus without complications: Secondary | ICD-10-CM | POA: Diagnosis not present

## 2016-05-10 DIAGNOSIS — E663 Overweight: Secondary | ICD-10-CM

## 2016-05-10 DIAGNOSIS — N644 Mastodynia: Secondary | ICD-10-CM

## 2016-05-10 NOTE — Progress Notes (Signed)
Medical Nutrition Therapy: Visit start time: 1330  end time: 1430  Assessment:  Diagnosis: Type 2 diabetes, overweight Past medical history: HTN, HLD, sleep apnea Psychosocial issues/ stress concerns: none Preferred learning method:  . Auditory   Current weight: 172.9lbs  Height: 5'4" Medications, supplements: reviewed list in chart with patient  Progress and evaluation: Patient reports having had diabetes for 20 years, reports last HbA1C of 9.7%. BGs have been increasing, diabetes care declining, since retiring 1.5 years ago. She did attend classes here several years ago, and saw RD in [place].    Physical activity: walking on treadmill 45-60 minutes, 3 times a week  Dietary Intake:  Usual eating pattern includes 3 meals and 3 snacks per day. Dining out frequency: 5-6 meals per week.  Breakfast: 10am -- English muffin with butter (light), coffee Snack: chips, cookies, candy Lunch: 2pm often out-- chicken nuggets, tea;  Snack: chips, cookies, candy Supper: 7pm -- varies -- chicken, occasionally beef or pork chop; rice, mashed potatoes, rarely vegetables collard greens, green beans, can eat salad but not much. Eats apples, bananas, grapes, melons, doesn't like veg.  Snack: chips, cookies, candy Beverages: water, coffee, tea with Splenda (not lately); koolaid with 5(%)  Nutrition Care Education: Topics covered: diabetes Basic nutrition: basic food groups, appropriate nutrient balance, appropriate meal and snack schedule, general nutrition guidelines    Weight control: benefits of weight control, strategies for controlling snack habits and choices; benefits of tracking food intake.  Diabetes: appropriate meal and snack schedule, appropriate carb intake and balance, basic meal planning for 1200kcal daily intake-- used plate method and food models; importance of fiber, vegetables, and whole grains in controlling BGs; role of exercise in BG control   Nutritional Diagnosis:  -2.2  Altered nutrition-related laboratory As related to type 2 diabetes.  As evidenced by lab results, patient report.  Intervention: Instruction as noted above.   Set goals with patient direction.   Commended patient for implementing regular exercise.   Education Materials given:  . General diet guidelines for Diabetes . Food lists/ Planning A Balanced Meal . Sample meal pattern/ menus . Snacking handout . Goals/ instructions  Learner/ who was taught:  . Patient   Level of understanding: Marland Kitchen. Verbalizes/ demonstrates competency  Demonstrated degree of understanding via:   Teach back Learning barriers: . None  Willingness to learn/ readiness for change: . Eager, change in progress  Monitoring and Evaluation:  Dietary intake, exercise, and body weight      follow up: 07/05/16

## 2016-05-10 NOTE — Patient Instructions (Signed)
   Make healthier food choices at meals, use food lists and sample menus for ideas.   Try having fruits for snacks, and combine with a few nuts or lowfat cheese   Increase vegetables by first having a small salad daily.   Keep up your regular exercise, great job!  Consider tracking your food intake using a phone app such as Lose It, MyFitnessPal, or Go Meals ( for diabetes)

## 2016-05-11 ENCOUNTER — Encounter: Payer: Self-pay | Admitting: Dietician

## 2016-05-14 ENCOUNTER — Telehealth: Payer: Self-pay

## 2016-05-14 NOTE — Telephone Encounter (Signed)
Please review EGD results in labs and advise.  

## 2016-05-16 NOTE — Telephone Encounter (Signed)
Normal duodenal bx

## 2016-05-16 NOTE — Telephone Encounter (Signed)
Pt notified of EGD results.  

## 2016-05-22 ENCOUNTER — Inpatient Hospital Stay
Admission: RE | Admit: 2016-05-22 | Discharge: 2016-05-22 | Disposition: A | Discharge status: Home / Self Care | Payer: Self-pay | Source: Ambulatory Visit | Attending: *Deleted | Admitting: *Deleted

## 2016-05-22 ENCOUNTER — Other Ambulatory Visit: Payer: Self-pay | Admitting: *Deleted

## 2016-05-22 ENCOUNTER — Ambulatory Visit: Payer: BLUE CROSS/BLUE SHIELD | Admitting: Gastroenterology

## 2016-05-22 DIAGNOSIS — Z9289 Personal history of other medical treatment: Secondary | ICD-10-CM

## 2016-05-30 ENCOUNTER — Ambulatory Visit: Payer: BLUE CROSS/BLUE SHIELD | Admitting: Internal Medicine

## 2016-06-07 ENCOUNTER — Ambulatory Visit
Admission: RE | Admit: 2016-06-07 | Discharge: 2016-06-07 | Disposition: A | Discharge status: Home / Self Care | Payer: BLUE CROSS/BLUE SHIELD | Source: Ambulatory Visit | Attending: Family | Admitting: Family

## 2016-06-07 DIAGNOSIS — Z1231 Encounter for screening mammogram for malignant neoplasm of breast: Secondary | ICD-10-CM | POA: Insufficient documentation

## 2016-06-07 DIAGNOSIS — R0789 Other chest pain: Secondary | ICD-10-CM | POA: Insufficient documentation

## 2016-06-07 DIAGNOSIS — Z1239 Encounter for other screening for malignant neoplasm of breast: Secondary | ICD-10-CM

## 2016-07-05 ENCOUNTER — Ambulatory Visit: Payer: BLUE CROSS/BLUE SHIELD | Admitting: Dietician

## 2016-07-09 ENCOUNTER — Telehealth: Payer: Self-pay | Admitting: Family

## 2016-07-09 MED ORDER — GLIMEPIRIDE 2 MG PO TABS
2.0000 mg | ORAL_TABLET | Freq: Every day | ORAL | 1 refills | Status: AC
Start: 2016-07-09 — End: ?

## 2016-07-09 NOTE — Telephone Encounter (Signed)
Medication has been refilled.

## 2016-07-09 NOTE — Telephone Encounter (Signed)
glimepiride (AMARYL) 2 MG tablet ° °

## 2016-07-13 ENCOUNTER — Encounter: Payer: Self-pay | Admitting: Dietician

## 2016-07-13 NOTE — Progress Notes (Signed)
Patient cancelled appointment which was scheduled for 07/05/16, and did not wish to reschedule. Sent discharge letter to NP.

## 2017-02-25 ENCOUNTER — Telehealth: Payer: Self-pay | Admitting: Family

## 2017-02-25 NOTE — Telephone Encounter (Signed)
Called patient to setup appointment for [facility] Quality Metric BP Follow up, left message for patient to call office , please schedule BP fu with PCP.

## 2017-08-11 ENCOUNTER — Telehealth: Payer: Self-pay | Admitting: Family

## 2017-08-11 NOTE — Telephone Encounter (Signed)
Please mail letter-   Hope you are well.   In reviewing your chart, it appears your are due for annual mammogram.  Please let us know if you would like for me to order. You may call the office to let us know, and we will order for you.   Once the order in the system, you may schedule at your preferred location.   Typically women have been using one of the sites below however you may go where you have been in the past. I would ensure that when you do get a mammogram that it is 3D ( as opposed to 2D which was prior technology). Evidence suggests that 3D is superior.   Please note that NOT all insurance companies cover 3D, and you may have to pay a higher copay. You may call your insurance company to further clarify your benefits.   Options for Mammogram in [place]:    [facility]  [address]  [place], [place]  [phone]   [facility] [address] [place], [place] [phone]   Let us know if you have questions.   My best,   Margaret Arnett, NP   

## 2017-09-04 ENCOUNTER — Encounter: Payer: Self-pay | Admitting: Family

## 2017-09-16 NOTE — Telephone Encounter (Signed)
Unread mychart message mailed to patient 

## 2017-09-18 ENCOUNTER — Telehealth: Payer: Self-pay | Admitting: Family

## 2017-09-18 NOTE — Telephone Encounter (Signed)
Call or mail pt She didn't read mychart message   Brandi Mathis,   Hope you are well.   Your mammogram appears due.   Please schedule a follow up appointment with me if you would like for me to order .   Look forward to seeing you.   Best,

## 2017-09-20 NOTE — Telephone Encounter (Signed)
Mailed letter °

## 2017-12-24 ENCOUNTER — Other Ambulatory Visit: Payer: Self-pay | Admitting: Family Medicine

## 2017-12-24 DIAGNOSIS — Z1239 Encounter for other screening for malignant neoplasm of breast: Secondary | ICD-10-CM

## 2018-01-06 ENCOUNTER — Ambulatory Visit: Payer: BLUE CROSS/BLUE SHIELD | Admitting: Podiatry

## 2018-01-06 ENCOUNTER — Encounter: Payer: Self-pay | Admitting: Podiatry

## 2018-01-06 DIAGNOSIS — E1142 Type 2 diabetes mellitus with diabetic polyneuropathy: Secondary | ICD-10-CM

## 2018-01-06 MED ORDER — PREGABALIN 100 MG PO CAPS: 100.0000 mg | ORAL_CAPSULE | Freq: Two times a day (BID) | ORAL | 2 refills | Status: AC

## 2018-01-06 NOTE — Progress Notes (Signed)
Presents today for follow-up diabetic peripheral neuropathy bilateral stent states that still take the Elavil but it really does not seem to be making any difference for me.  Objective: Vital signs are stable alert and oriented x3 no change in his ago exam.  Assessment: Diabetic peripheral neuropathy.  Plan: Start her on Lyrica 100 mg 1 p.o. twice daily on a follow-up with her in 1 month for evaluation we discussed the pros and cons and use of this medication and the possible side effects.

## 2018-01-28 ENCOUNTER — Ambulatory Visit
Admission: RE | Admit: 2018-01-28 | Discharge: 2018-01-28 | Disposition: A | Discharge status: Home / Self Care | Payer: BLUE CROSS/BLUE SHIELD | Source: Ambulatory Visit | Attending: Family Medicine | Admitting: Family Medicine

## 2018-01-28 DIAGNOSIS — Z1231 Encounter for screening mammogram for malignant neoplasm of breast: Secondary | ICD-10-CM | POA: Insufficient documentation

## 2018-01-28 DIAGNOSIS — Z1239 Encounter for other screening for malignant neoplasm of breast: Secondary | ICD-10-CM

## 2018-02-05 ENCOUNTER — Ambulatory Visit: Payer: BLUE CROSS/BLUE SHIELD | Admitting: Podiatry

## 2018-09-05 ENCOUNTER — Emergency Department
Admission: EM | Admit: 2018-09-05 | Discharge: 2018-09-05 | Disposition: A | Discharge status: Home / Self Care | Payer: BLUE CROSS/BLUE SHIELD | Attending: Emergency Medicine | Admitting: Emergency Medicine

## 2018-09-05 ENCOUNTER — Emergency Department: Payer: BLUE CROSS/BLUE SHIELD

## 2018-09-05 ENCOUNTER — Other Ambulatory Visit: Payer: Self-pay

## 2018-09-05 DIAGNOSIS — I10 Essential (primary) hypertension: Secondary | ICD-10-CM | POA: Diagnosis not present

## 2018-09-05 DIAGNOSIS — Z79899 Other long term (current) drug therapy: Secondary | ICD-10-CM | POA: Insufficient documentation

## 2018-09-05 DIAGNOSIS — Z7982 Long term (current) use of aspirin: Secondary | ICD-10-CM | POA: Insufficient documentation

## 2018-09-05 DIAGNOSIS — R079 Chest pain, unspecified: Secondary | ICD-10-CM | POA: Diagnosis present

## 2018-09-05 DIAGNOSIS — R748 Abnormal levels of other serum enzymes: Secondary | ICD-10-CM

## 2018-09-05 DIAGNOSIS — E119 Type 2 diabetes mellitus without complications: Secondary | ICD-10-CM | POA: Insufficient documentation

## 2018-09-05 DIAGNOSIS — K219 Gastro-esophageal reflux disease without esophagitis: Secondary | ICD-10-CM

## 2018-09-05 LAB — COMPREHENSIVE METABOLIC PANEL
ALK PHOS: 39 U/L (ref 38–126)
ALT: 25 U/L (ref 0–44)
ANION GAP: 17 — AB (ref 5–15)
AST: 34 U/L (ref 15–41)
Albumin: 4.7 g/dL (ref 3.5–5.0)
BUN: 19 mg/dL (ref 8–23)
CHLORIDE: 95 mmol/L — AB (ref 98–111)
CO2: 24 mmol/L (ref 22–32)
Calcium: 10.2 mg/dL (ref 8.9–10.3)
Creatinine, Ser: 1.07 mg/dL — ABNORMAL HIGH (ref 0.44–1.00)
GFR calc non Af Amer: 56 mL/min — ABNORMAL LOW (ref >60)
Glucose, Bld: 154 mg/dL — ABNORMAL HIGH (ref 70–99)
Potassium: 3.8 mmol/L (ref 3.5–5.1)
SODIUM: 136 mmol/L (ref 135–145)
Total Bilirubin: 0.3 mg/dL (ref 0.3–1.2)
Total Protein: 7.7 g/dL (ref 6.5–8.1)

## 2018-09-05 LAB — LIPASE, BLOOD: Lipase: 117 U/L — ABNORMAL HIGH (ref 11–51)

## 2018-09-05 LAB — CBC
HCT: 35.9 % — ABNORMAL LOW (ref 36.0–46.0)
Hemoglobin: 12.2 g/dL (ref 12.0–15.0)
MCH: 30.7 pg (ref 26.0–34.0)
MCHC: 34 g/dL (ref 30.0–36.0)
MCV: 90.4 fL (ref 80.0–100.0)
PLATELETS: 314 10*3/uL (ref 150–400)
RBC: 3.97 MIL/uL (ref 3.87–5.11)
RDW: 12.8 % (ref 11.5–15.5)
WBC: 6.3 10*3/uL (ref 4.0–10.5)
nRBC: 0 % (ref 0.0–0.2)

## 2018-09-05 LAB — TROPONIN I
Troponin I: <0.03 ng/mL (ref <0.03)
Troponin I: <0.03 ng/mL (ref <0.03)

## 2018-09-05 MED ORDER — SODIUM CHLORIDE 0.9 % IV BOLUS
1000.0000 mL | Freq: Once | INTRAVENOUS | Status: AC
  Administered 2018-09-05: 1000 mL via INTRAVENOUS

## 2018-09-05 MED ORDER — FAMOTIDINE 40 MG PO TABS: 40.0000 mg | ORAL_TABLET | Freq: Every day | ORAL | 1 refills | Status: AC

## 2018-09-05 NOTE — ED Notes (Signed)
Brandi Mathis from lab reports to send down troponin lab with patient label and he would print label at lab for blood tube

## 2018-09-05 NOTE — ED Triage Notes (Signed)
Pt in with co chest pain that started yesterday, with face numbness. Pt denies any fever or shob.

## 2018-09-05 NOTE — ED Provider Notes (Signed)
[facility] Emergency Department Provider Note  ____________________________________________  Time seen: Approximately 7:49 AM  I have reviewed the triage vital signs and the nursing notes.   HISTORY  Chief Complaint Chest Pain   HPI Cieara Ryle is a 63 y.o. female with history of DM, HLD, HTN, GERD who presents for evaluation of chest pain. Patient reports that for the last 3 days she has had pressure like pain in the center of her chest every time she eats.  The pain goes away after she takes a Tums.  The last episode of pain was yesterday at 5 PM.  Yesterday she also had a mild right-sided headache that she describes as pressure associated with pressure on the right side of her face.  She reports similar headaches in the setting of sinus infections.  She has had mild congestion but no cough, no fever, no shortness of breath.  She currently has no chest pain.  She denies abdominal pain, nausea, vomiting, diarrhea.  She denies NSAID use or alcohol use.  She has no headache at this time.  No slurred speech, facial droop, unilateral weakness or numbness, thunderclap headache.  Past Medical History:  Diagnosis Date   Arthritis    Diabetes mellitus without complication (HCC)    Hyperlipidemia    Hypertension     Patient Active Problem List   Diagnosis Date Noted   SOB (shortness of breath) 05/07/2016   Dyspepsia    Fatty liver 04/18/2016   Family history of polyps in the colon 04/17/2016   Change in bowel habits 04/17/2016   Bloating symptom 04/17/2016   OSA (obstructive sleep apnea) 04/05/2016   Excessive gas 04/05/2016   Gastroesophageal reflux disease 04/05/2016   Neuropathy 05/13/2015   Diabetes mellitus type II, uncontrolled (HCC) 05/13/2015   Encounter to establish care 04/08/2015   Right-sided chest wall pain 04/08/2015   Benign essential HTN 04/08/2015   Hyperlipidemia 04/08/2015   Overweight (BMI 25.0-29.9) 04/08/2015     Past Surgical History:  Procedure Laterality Date   ABDOMINAL HYSTERECTOMY  2003   unsure if ovaries still there   COLONOSCOPY WITH PROPOFOL N/A 04/26/2016   Procedure: COLONOSCOPY WITH PROPOFOL;  Surgeon: Wyline Mood, MD;  Location: [facility] ENDOSCOPY;  Service: Endoscopy;  Laterality: N/A;   ESOPHAGOGASTRODUODENOSCOPY (EGD) WITH PROPOFOL N/A 04/26/2016   Procedure: ESOPHAGOGASTRODUODENOSCOPY (EGD) WITH PROPOFOL;  Surgeon: Wyline Mood, MD;  Location: [facility] ENDOSCOPY;  Service: Endoscopy;  Laterality: N/A;   left nephrectomy      Prior to Admission medications   Medication Sig Start Date End Date Taking? Authorizing Provider  acetaminophen (TYLENOL) 325 MG tablet Take 650 mg by mouth every 6 (six) hours as needed.    [provider]  amitriptyline (ELAVIL) 10 MG tablet Take 1-3 tablets at night by mouth 06/22/15   Doss, Oleh Genin, RN  aspirin EC 81 MG tablet Take 81 mg by mouth daily.    [provider]  atenolol (TENORMIN) 25 MG tablet  12/30/17   [provider]  atorvastatin (LIPITOR) 20 MG tablet Take 1 tablet (20 mg total) by mouth daily. 05/13/15   Carollee Leitz, RN  Coenzyme Q10 (CO Q 10) 10 MG CAPS Take 200 mg by mouth daily.     [provider]  cyclobenzaprine (FLEXERIL) 5 MG tablet TK 1 T PO Q EVE 10/18/17   [provider]  diclofenac (VOLTAREN) 50 MG EC tablet Take 1 tablet (50 mg total) by mouth daily as needed. Patient not taking:  Reported on 05/10/2016 05/07/16   Allegra Grana, FNP  famotidine (PEPCID) 40 MG tablet Take 1 tablet (40 mg total) by mouth at bedtime. 09/05/18 09/05/19  Nita Sickle, MD  glimepiride (AMARYL) 2 MG tablet Take 1 tablet (2 mg total) by mouth daily with breakfast. 07/09/16   Allegra Grana, FNP  Insulin Pen Needle (BD PEN NEEDLE MICRO U/F) 32G X 6 MM MISC  11/29/16   [provider]  losartan-hydrochlorothiazide (HYZAAR) 100-25 MG tablet Take 1 tablet by mouth daily. 05/13/15   Carollee Leitz, RN  metFORMIN (GLUCOPHAGE) 1000 MG tablet TAKE 1 TABLET TWICE A DAY 09/19/15   Carollee Leitz, RN  Multiple Vitamins-Minerals (MULTIVITAMIN ADULTS 50+ PO) Take by mouth.    [provider]  naproxen (NAPROSYN) 500 MG tablet TK 1 T PO BID WF 10/18/17   [provider]  omeprazole (PRILOSEC) 40 MG capsule Take 1 capsule (40 mg total) by mouth daily. Patient not taking: Reported on 05/10/2016 04/08/15   Carollee Leitz, RN  pregabalin (LYRICA) 100 MG capsule Take 1 capsule (100 mg total) by mouth 2 (two) times daily. 01/06/18   Hyatt, Max T, DPM  TRULICITY 1.5 MG/0.5ML [facility]  11/19/17   [provider]    Allergies Bioflavonoids  Family History  Problem Relation Age of Onset   Hypertension Mother    CAD Other        multiple relatives with stents   Breast cancer Neg Hx     Social History Social History   Tobacco Use   Smoking status: Never Smoker   Smokeless tobacco: Never Used  Substance Use Topics   Alcohol use: No    Alcohol/week: 0.0 standard drinks   Drug use: No    Review of Systems  Constitutional: Negative for fever. Eyes: Negative for visual changes. ENT: Negative for sore throat. Neck: No neck pain  Cardiovascular:+ chest pain. Respiratory: Negative for shortness of breath. Gastrointestinal: Negative for abdominal pain, vomiting or diarrhea. Genitourinary: Negative for dysuria. Musculoskeletal: Negative for back pain. Skin: Negative for rash. Neurological: Negative for weakness or numbness. + HA Psych: No SI or HI  ____________________________________________   PHYSICAL EXAM:  VITAL SIGNS: ED Triage Vitals  Enc Vitals Group     BP 09/05/18 0431 (!) 161/95     Pulse Rate 09/05/18 0431 96     Resp 09/05/18 0431 20     Temp 09/05/18 0431 98.2 F (36.8 C)     Temp Source 09/05/18 0431 Oral     SpO2 09/05/18 0431 97 %     Weight 09/05/18 0421 168 lb (76.2 kg)     Height --      Head Circumference --      Peak  Flow --      Pain Score 09/05/18 0421 6     Pain Loc --      Pain Edu? --      Excl. in GC? --     Constitutional: Alert and oriented. Well appearing and in no apparent distress. HEENT:      Head: Normocephalic and atraumatic.         Eyes: Conjunctivae are normal. Sclera is non-icteric.       Mouth/Throat: Mucous membranes are moist.       Neck: Supple with no signs of meningismus. Cardiovascular: Regular rate and rhythm. No murmurs, gallops, or rubs. 2+ symmetrical distal pulses are present in all extremities. No JVD. Respiratory: Normal respiratory effort. Lungs are clear  to auscultation bilaterally. No wheezes, crackles, or rhonchi.  Gastrointestinal: Soft, non tender, and non distended with positive bowel sounds. No rebound or guarding. Musculoskeletal: Nontender with normal range of motion in all extremities. No edema, cyanosis, or erythema of extremities. Neurologic: Normal speech and language. Face is symmetric. Moving all extremities. No gross focal neurologic deficits are appreciated. Skin: Skin is warm, dry and intact. No rash noted. Psychiatric: Mood and affect are normal. Speech and behavior are normal.  ____________________________________________   LABS (all labs ordered are listed, but only abnormal results are displayed)  Labs Reviewed  CBC - Abnormal; Notable for the following components:      Result Value   HCT 35.9 (*)    All other components within normal limits  COMPREHENSIVE METABOLIC PANEL - Abnormal; Notable for the following components:   Chloride 95 (*)    Glucose, Bld 154 (*)    Creatinine, Ser 1.07 (*)    GFR calc non Af Amer 56 (*)    Anion gap 17 (*)    All other components within normal limits  LIPASE, BLOOD - Abnormal; Notable for the following components:   Lipase 117 (*)    All other components within normal limits  TROPONIN I  TROPONIN I   ____________________________________________  EKG  ED ECG REPORT I, Nita Sickle, the  attending physician, personally viewed and interpreted this ECG.  Normal sinus rhythm, rate of 93, normal intervals, normal axis, no ST elevations or depressions, anterior Q waves.  Unchanged from prior ____________________________________________  RADIOLOGY  I have personally reviewed the images performed during this visit and I agree with the Radiologist's read.   Interpretation by Radiologist:  Dg Chest Portable 1 View  Result Date: 09/05/2018 CLINICAL DATA:  63 year old female with complaint of chest pain since yesterday. No shortness of breath or fever. EXAM: PORTABLE CHEST 1 VIEW COMPARISON:  Chest x-ray 06/25/2009. FINDINGS: Lung volumes are normal. No consolidative airspace disease. No pleural effusions. No pneumothorax. No pulmonary nodule or mass noted. Pulmonary vasculature and the cardiomediastinal silhouette are within normal limits. IMPRESSION: No radiographic evidence of acute cardiopulmonary disease. Electronically Signed   By: Trudie Reed M.D.   On: 09/05/2018 08:06   US Abdomen Limited Ruq  Result Date: 09/05/2018 CLINICAL DATA:  Elevated serum lipase EXAM: ULTRASOUND ABDOMEN LIMITED RIGHT UPPER QUADRANT COMPARISON:  April 12, 2016 FINDINGS: Gallbladder: No gallstones are evident. The gallbladder wall is borderline thickened with gallbladder appearing mildly contracted period no pericholecystic fluid evident. No sonographic Murphy sign noted by sonographer. Common bile duct: Diameter: 5 mm. No intrahepatic or extrahepatic biliary duct dilatation. Liver: No focal lesion identified. Liver echogenicity is increased and somewhat coarsened. Portal vein is patent on color Doppler imaging with normal direction of blood flow towards the liver. IMPRESSION: 1. Liver echogenicity is increased and somewhat coarsened. The appearance is indicative of hepatic steatosis with questionable underlying parenchymal liver disease. While no focal liver lesions are evident, it must be cautioned  that the sensitivity of ultrasound for detection of focal liver lesions is diminished significantly in this circumstance. 2. Gallbladder wall is slightly thickened without gallstones evident. Gallbladder appears mildly contracted. Significance of the mild thickening of the gallbladder wall is uncertain. In this regard, it may be prudent to consider nuclear medicine hepatobiliary imaging study to assess for cystic duct patency. Electronically Signed   By: Bretta Bang III M.D.   On: 09/05/2018 08:55      ____________________________________________   PROCEDURES  Procedure(s) performed: None Procedures Critical  Care performed:  None ____________________________________________   INITIAL IMPRESSION / ASSESSMENT AND PLAN / ED COURSE   63 y.o. female with history of DM, HLD, HTN, GERD who presents for evaluation of atypical chest pain, pressure like for the last 3 days that presents after she eats and goes away with TUMS.  Patient is well-appearing in no distress, has a nonischemic EKG.  Abdomen is soft with no tenderness.  Labs do show mild dehydration with a bump in her creatinine, mild anion gap of 17 therefore will give IV fluids.  Patient's lipase is slightly elevated at 117. No personal or family history of pancreatic cancer, no unintentional weight loss, no abdominal pain, nausea or vomiting.  She has no tenderness in her abdomen.  Since her pain seems to be coming and going after she eats we will send her for a right upper quadrant ultrasound to rule out gallstones. At this time patient is asymptomatic and has no CP or HA. Troponin is negative, 2nd troponin is pending.   Clinical Course as of Sep 04 921  Fri Sep 05, 2018  0921 Ultrasound negative for stones.  Did discuss with the patient the findings seen on the ultrasound of hepatic steatosis.  Recommended close follow-up with primary care doctor for monitoring of patient's lipase.  Discussed increase oral hydration at home.   Recommended daily Pepcid to prevent episodes of reflux.  At this time patient stable for discharge home.   [CV]    Clinical Course User Index [CV] Don Perking [place], MD     As part of my medical decision making, I reviewed the following data within the electronic MEDICAL RECORD NUMBER Nursing notes reviewed and incorporated, Labs reviewed , EKG interpreted , Old EKG reviewed, Old chart reviewed, Radiograph reviewed , Notes from prior ED visits and [place] Controlled Substance Database    Pertinent labs & imaging results that were available during my care of the patient were reviewed by me and considered in my medical decision making (see chart for details).    ____________________________________________   FINAL CLINICAL IMPRESSION(S) / ED DIAGNOSES  Final diagnoses:  Elevated lipase  Gastroesophageal reflux disease, esophagitis presence not specified      NEW MEDICATIONS STARTED DURING THIS VISIT:  ED Discharge Orders         Ordered    famotidine (PEPCID) 40 MG tablet  Daily at bedtime     09/05/18 1610           Note:  This document was prepared using Dragon voice recognition software and may include unintentional dictation errors.    Don Perking, [place], MD 09/05/18 [phone]

## 2018-09-05 NOTE — Discharge Instructions (Signed)
As I explained to you your labs showed mild dehydration.  Increase your oral hydration at home.  Her labs also showed mildly elevated pancreatic enzymes.  The ultrasound of your gallbladder did not show any evidence of stones.  Make sure to follow-up with your primary care doctor in the next 3 days for further evaluation of this abnormality.  Take Pepcid every evening to help keep your reflux symptoms under control.  Return to the emergency room for new or worsening chest pain, shortness of breath, abdominal pain, fever or vomiting.

## 2018-09-05 NOTE — ED Notes (Signed)
Patient given warm blanket by Bertram Denver

## 2018-09-07 ENCOUNTER — Emergency Department: Payer: BLUE CROSS/BLUE SHIELD

## 2018-09-07 ENCOUNTER — Other Ambulatory Visit: Payer: Self-pay

## 2018-09-07 ENCOUNTER — Emergency Department
Admission: EM | Admit: 2018-09-07 | Discharge: 2018-09-07 | Disposition: A | Discharge status: Home / Self Care | Payer: BLUE CROSS/BLUE SHIELD | Attending: Emergency Medicine | Admitting: Emergency Medicine

## 2018-09-07 ENCOUNTER — Encounter: Payer: Self-pay | Admitting: Emergency Medicine

## 2018-09-07 DIAGNOSIS — Z7982 Long term (current) use of aspirin: Secondary | ICD-10-CM | POA: Diagnosis not present

## 2018-09-07 DIAGNOSIS — J0110 Acute frontal sinusitis, unspecified: Secondary | ICD-10-CM | POA: Insufficient documentation

## 2018-09-07 DIAGNOSIS — E119 Type 2 diabetes mellitus without complications: Secondary | ICD-10-CM | POA: Diagnosis not present

## 2018-09-07 DIAGNOSIS — R51 Headache: Secondary | ICD-10-CM | POA: Diagnosis present

## 2018-09-07 DIAGNOSIS — I10 Essential (primary) hypertension: Secondary | ICD-10-CM | POA: Insufficient documentation

## 2018-09-07 DIAGNOSIS — Z79899 Other long term (current) drug therapy: Secondary | ICD-10-CM | POA: Insufficient documentation

## 2018-09-07 LAB — CBC WITH DIFFERENTIAL/PLATELET
Abs Immature Granulocytes: 0.02 10*3/uL (ref 0.00–0.07)
Basophils Absolute: 0 10*3/uL (ref 0.0–0.1)
Basophils Relative: 1 %
Eosinophils Absolute: 0.3 10*3/uL (ref 0.0–0.5)
Eosinophils Relative: 4 %
HCT: 33.3 % — ABNORMAL LOW (ref 36.0–46.0)
HEMOGLOBIN: 11.3 g/dL — AB (ref 12.0–15.0)
Immature Granulocytes: 0 %
LYMPHS ABS: 2.3 10*3/uL (ref 0.7–4.0)
Lymphocytes Relative: 34 %
MCH: 30.3 pg (ref 26.0–34.0)
MCHC: 33.9 g/dL (ref 30.0–36.0)
MCV: 89.3 fL (ref 80.0–100.0)
Monocytes Absolute: 0.5 10*3/uL (ref 0.1–1.0)
Monocytes Relative: 7 %
NRBC: 0 % (ref 0.0–0.2)
Neutro Abs: 3.6 10*3/uL (ref 1.7–7.7)
Neutrophils Relative %: 54 %
Platelets: 327 10*3/uL (ref 150–400)
RBC: 3.73 MIL/uL — ABNORMAL LOW (ref 3.87–5.11)
RDW: 12.8 % (ref 11.5–15.5)
WBC: 6.7 10*3/uL (ref 4.0–10.5)

## 2018-09-07 LAB — URINALYSIS, COMPLETE (UACMP) WITH MICROSCOPIC
Bilirubin Urine: NEGATIVE
Glucose, UA: NEGATIVE mg/dL
Hgb urine dipstick: NEGATIVE
Ketones, ur: NEGATIVE mg/dL
Leukocytes,Ua: NEGATIVE
Nitrite: NEGATIVE
PROTEIN: NEGATIVE mg/dL
Specific Gravity, Urine: 1.015 (ref 1.005–1.030)
pH: 5 (ref 5.0–8.0)

## 2018-09-07 LAB — COMPREHENSIVE METABOLIC PANEL
ALT: 26 U/L (ref 0–44)
AST: 30 U/L (ref 15–41)
Albumin: 4.3 g/dL (ref 3.5–5.0)
Alkaline Phosphatase: 37 U/L — ABNORMAL LOW (ref 38–126)
Anion gap: 12 (ref 5–15)
BUN: 16 mg/dL (ref 8–23)
CHLORIDE: 98 mmol/L (ref 98–111)
CO2: 24 mmol/L (ref 22–32)
Calcium: 10 mg/dL (ref 8.9–10.3)
Creatinine, Ser: 0.96 mg/dL (ref 0.44–1.00)
Glucose, Bld: 128 mg/dL — ABNORMAL HIGH (ref 70–99)
Potassium: 3.5 mmol/L (ref 3.5–5.1)
Sodium: 134 mmol/L — ABNORMAL LOW (ref 135–145)
Total Bilirubin: 0.3 mg/dL (ref 0.3–1.2)
Total Protein: 7.4 g/dL (ref 6.5–8.1)

## 2018-09-07 MED ORDER — AMOXICILLIN 500 MG PO CAPS: 500.0000 mg | ORAL_CAPSULE | Freq: Three times a day (TID) | ORAL | 0 refills | Status: AC

## 2018-09-07 NOTE — ED Notes (Signed)
Pt states that she has no prior hx of headaches and this headache has persisted since this morning and not gone away. Pt states she took 2 ibuprofen and 2 tylenol but unknown mg with no help.

## 2018-09-07 NOTE — Discharge Instructions (Addendum)
Take the amoxicillin 1 pill 3 times a day.  Use a over-the-counter decongestant for 2 or 3 days only.  Also use salt water nasal spray 5 or 6 times a day to your better.  Please return for increasing pain fever or feeling sicker.

## 2018-09-07 NOTE — ED Notes (Signed)
Pt declined PIV due to bruising and scarring due to last PIV that was inserted in RAC. Pt asked for medications orally and to be straight-stuck.

## 2018-09-07 NOTE — ED Provider Notes (Signed)
[facility] Emergency Department Provider Note   ____________________________________________   First MD Initiated Contact with Patient 09/07/18 [phone]     (approximate)  I have reviewed the triage vital signs and the nursing notes.   HISTORY  Chief Complaint Headache    HPI Brandi Mathis is a 63 y.o. female who reports onset of a headache yesterday morning.  It is not gone away.  It is right frontal worse if she bends forward.  She is not having a stuffy nose or feeling congested but she is having some chills.  She had a little dysuria and thought maybe she was having a UTI but her urine is clear.         Past Medical History:  Diagnosis Date  . Arthritis   . Diabetes mellitus without complication (HCC)   . Hyperlipidemia   . Hypertension     Patient Active Problem List   Diagnosis Date Noted  . SOB (shortness of breath) 05/07/2016  . Dyspepsia   . Fatty liver 04/18/2016  . Family history of polyps in the colon 04/17/2016  . Change in bowel habits 04/17/2016  . Bloating symptom 04/17/2016  . OSA (obstructive sleep apnea) 04/05/2016  . Excessive gas 04/05/2016  . Gastroesophageal reflux disease 04/05/2016  . Neuropathy 05/13/2015  . Diabetes mellitus type II, uncontrolled (HCC) 05/13/2015  . Encounter to establish care 04/08/2015  . Right-sided chest wall pain 04/08/2015  . Benign essential HTN 04/08/2015  . Hyperlipidemia 04/08/2015  . Overweight (BMI 25.0-29.9) 04/08/2015    Past Surgical History:  Procedure Laterality Date  . ABDOMINAL HYSTERECTOMY  2003   unsure if ovaries still there  . COLONOSCOPY WITH PROPOFOL N/A 04/26/2016   Procedure: COLONOSCOPY WITH PROPOFOL;  Surgeon: Wyline Mood, MD;  Location: [facility] ENDOSCOPY;  Service: Endoscopy;  Laterality: N/A;  . ESOPHAGOGASTRODUODENOSCOPY (EGD) WITH PROPOFOL N/A 04/26/2016   Procedure: ESOPHAGOGASTRODUODENOSCOPY (EGD) WITH PROPOFOL;  Surgeon: Wyline Mood, MD;  Location: [facility]  ENDOSCOPY;  Service: Endoscopy;  Laterality: N/A;  . left nephrectomy      Prior to Admission medications   Medication Sig Start Date End Date Taking? Authorizing Provider  acetaminophen (TYLENOL) 325 MG tablet Take 650 mg by mouth every 6 (six) hours as needed.    [provider]  amitriptyline (ELAVIL) 10 MG tablet Take 1-3 tablets at night by mouth 06/22/15   Doss, Oleh Genin, RN  amoxicillin (AMOXIL) 500 MG capsule Take 1 capsule (500 mg total) by mouth 3 (three) times daily. 09/07/18   Arnaldo Natal, MD  aspirin EC 81 MG tablet Take 81 mg by mouth daily.    [provider]  atenolol (TENORMIN) 25 MG tablet  12/30/17   [provider]  atorvastatin (LIPITOR) 20 MG tablet Take 1 tablet (20 mg total) by mouth daily. 05/13/15   Carollee Leitz, RN  Coenzyme Q10 (CO Q 10) 10 MG CAPS Take 200 mg by mouth daily.     [provider]  cyclobenzaprine (FLEXERIL) 5 MG tablet TK 1 T PO Q EVE 10/18/17   [provider]  diclofenac (VOLTAREN) 50 MG EC tablet Take 1 tablet (50 mg total) by mouth daily as needed. Patient not taking: Reported on 05/10/2016 05/07/16   Allegra Grana, FNP  famotidine (PEPCID) 40 MG tablet Take 1 tablet (40 mg total) by mouth at bedtime. 09/05/18 09/05/19  Nita Sickle, MD  glimepiride (AMARYL) 2 MG tablet Take 1 tablet (2 mg total) by mouth daily with breakfast. 07/09/16  Allegra Grana, FNP  Insulin Pen Needle (BD PEN NEEDLE MICRO U/F) 32G X 6 MM MISC  11/29/16   [provider]  losartan-hydrochlorothiazide (HYZAAR) 100-25 MG tablet Take 1 tablet by mouth daily. 05/13/15   Carollee Leitz, RN  metFORMIN (GLUCOPHAGE) 1000 MG tablet TAKE 1 TABLET TWICE A DAY 09/19/15   Carollee Leitz, RN  Multiple Vitamins-Minerals (MULTIVITAMIN ADULTS 50+ PO) Take by mouth.    [provider]  naproxen (NAPROSYN) 500 MG tablet TK 1 T PO BID WF 10/18/17   [provider]  omeprazole (PRILOSEC) 40 MG capsule Take 1  capsule (40 mg total) by mouth daily. Patient not taking: Reported on 05/10/2016 04/08/15   Carollee Leitz, RN  pregabalin (LYRICA) 100 MG capsule Take 1 capsule (100 mg total) by mouth 2 (two) times daily. 01/06/18   Hyatt, Max T, DPM  TRULICITY 1.5 MG/0.5ML [facility]  11/19/17   [provider]    Allergies Bioflavonoids  Family History  Problem Relation Age of Onset  . Hypertension Mother   . CAD Other        multiple relatives with stents  . Breast cancer Neg Hx     Social History Social History   Tobacco Use  . Smoking status: Never Smoker  . Smokeless tobacco: Never Used  Substance Use Topics  . Alcohol use: No    Alcohol/week: 0.0 standard drinks  . Drug use: No    Review of Systems  Constitutional: chills Eyes: No visual changes. ENT: No sore throat. Cardiovascular: Denies chest pain. Respiratory: Denies shortness of breath. Gastrointestinal: No abdominal pain.  No nausea, no vomiting.  No diarrhea.  No constipation. Genitourinary: Negative for dysuria. Musculoskeletal: Negative for back pain. Skin: Negative for rash. Neurological: Negative for headaches, focal weakness  ____________________________________________   PHYSICAL EXAM:  VITAL SIGNS: ED Triage Vitals  Enc Vitals Group     BP 09/07/18 0432 (!) 177/98     Pulse Rate 09/07/18 0432 94     Resp 09/07/18 0432 15     Temp 09/07/18 0432 98.7 F (37.1 C)     Temp src --      SpO2 09/07/18 0432 97 %     Weight 09/07/18 0430 168 lb (76.2 kg)     Height 09/07/18 0430 5\' 4"  (1.626 m)     Head Circumference --      Peak Flow --      Pain Score 09/07/18 0430 7     Pain Loc --      Pain Edu? --      Excl. in GC? --     Constitutional: Alert and oriented. Well appearing and in no acute distress. Eyes: Conjunctivae are normal. PER. EOMI. Head: Atraumatic.  There is tenderness on palpation percussion of the right frontal area Nose: No congestion/rhinnorhea. Mouth/Throat: Mucous membranes are  moist.  Oropharynx non-erythematous. Neck: No stridor.   Cardiovascular: Normal rate, regular rhythm. Grossly normal heart sounds.  Good peripheral circulation. Respiratory: Normal respiratory effort.  No retractions. Lungs CTAB. Gastrointestinal: Soft and nontender. No distention. No abdominal bruits. No CVA tenderness. Musculoskeletal: No lower extremity tenderness nor edema.  . Neurologic:  Normal speech and language. No gross focal neurologic deficits are appreciated. Skin:  Skin is warm, dry and intact. No rash noted. Psychiatric: Mood and affect are normal. Speech and behavior are normal.  ____________________________________________   LABS (all labs ordered are listed, but only abnormal results are displayed)  Labs Reviewed  URINALYSIS,  COMPLETE (UACMP) WITH MICROSCOPIC - Abnormal; Notable for the following components:      Result Value   Color, Urine YELLOW (*)    APPearance HAZY (*)    Bacteria, UA FEW (*)    All other components within normal limits  COMPREHENSIVE METABOLIC PANEL - Abnormal; Notable for the following components:   Sodium 134 (*)    Glucose, Bld 128 (*)    Alkaline Phosphatase 37 (*)    All other components within normal limits  CBC WITH DIFFERENTIAL/PLATELET - Abnormal; Notable for the following components:   RBC 3.73 (*)    Hemoglobin 11.3 (*)    HCT 33.3 (*)    All other components within normal limits   ____________________________________________  EKG   ____________________________________________  RADIOLOGY  ED MD interpretation: T read by radiology reviewed by me.  Radiology reads it is negative.  Clinically the patient does have sinusitis in the frontal sinuses.  Review of CT shows what could be mucosal swelling or volume averaging.  In view of the patient's clinical exam I feel patient has sinusitis.  Official radiology report(s): Dg Chest 2 View  Result Date: 09/07/2018 CLINICAL DATA:  Chills. EXAM: CHEST - 2 VIEW COMPARISON:   Radiograph of September 05, 2018. FINDINGS: The heart size and mediastinal contours are within normal limits. Both lungs are clear. The visualized skeletal structures are unremarkable. IMPRESSION: No active cardiopulmonary disease. Electronically Signed   By: Lupita Raider, M.D.   On: 09/07/2018 07:07   Ct Head Wo Contrast  Result Date: 09/07/2018 CLINICAL DATA:  Persistent headache EXAM: CT HEAD WITHOUT CONTRAST TECHNIQUE: Contiguous axial images were obtained from the base of the skull through the vertex without intravenous contrast. COMPARISON:  06/15/2009 FINDINGS: Brain: No evidence of acute infarction, hemorrhage, hydrocephalus, extra-axial collection or mass lesion/mass effect. Mild subcortical white matter and periventricular small vessel ischemic changes. Vascular: No hyperdense vessel or unexpected calcification. Skull: Normal. Negative for fracture or focal lesion. Sinuses/Orbits: The visualized paranasal sinuses are essentially clear. The mastoid air cells are unopacified. Other: None. IMPRESSION: No evidence of acute intracranial abnormality. Mild small vessel ischemic changes. Electronically Signed   By: Charline Bills M.D.   On: 09/07/2018 06:59    ____________________________________________   PROCEDURES  Procedure(s) performed (including Critical Care):  Procedures   ____________________________________________   INITIAL IMPRESSION / ASSESSMENT AND PLAN / ED COURSE     Will treat the patient with amoxicillin 3 times a day for her apparent sinusitis.  She will return if she is worse.          ____________________________________________   FINAL CLINICAL IMPRESSION(S) / ED DIAGNOSES  Final diagnoses:  Acute non-recurrent frontal sinusitis     ED Discharge Orders         Ordered    amoxicillin (AMOXIL) 500 MG capsule  3 times daily     09/07/18 0725           Note:  This document was prepared using Dragon voice recognition software and may include  unintentional dictation errors.    Arnaldo Natal, MD 09/07/18 [phone]

## 2018-09-07 NOTE — ED Notes (Addendum)
Patient transported to CT and X ray 

## 2018-09-07 NOTE — ED Triage Notes (Signed)
Pt arrives POV and ambulatory to triage with HA since Thursday and painful urination. Pt is in NAD.

## 2018-09-07 NOTE — ED Notes (Signed)
Pt reports having chills, feeling "shakey" and has a headache;

## 2019-11-04 IMAGING — CT CT HEAD WITHOUT CONTRAST
3 series · 16 of 47 positions shown, 19 images · non-contrast
Comparison: 06/15/2009

CLINICAL DATA: Persistent headache

EXAM:
CT HEAD WITHOUT CONTRAST
TECHNIQUE: Contiguous axial images were obtained from the base of the skull
through the vertex without intravenous contrast.

[Series 2: head wo · axial · 0.43mm/px · z∈[-91,+34]mm · 10 of 31 slices shown, 13 images]
[im 3/31  brain]
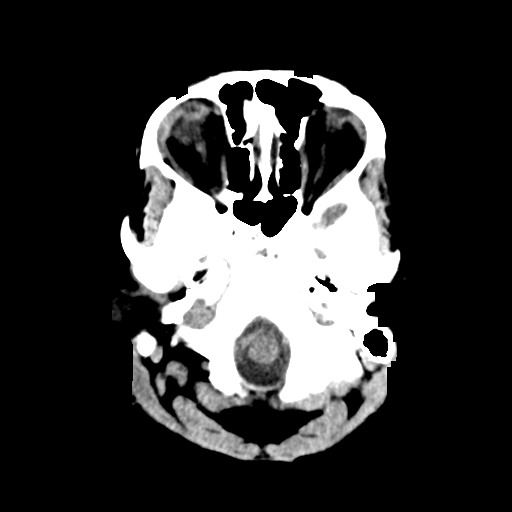
[im 3/31  bone]
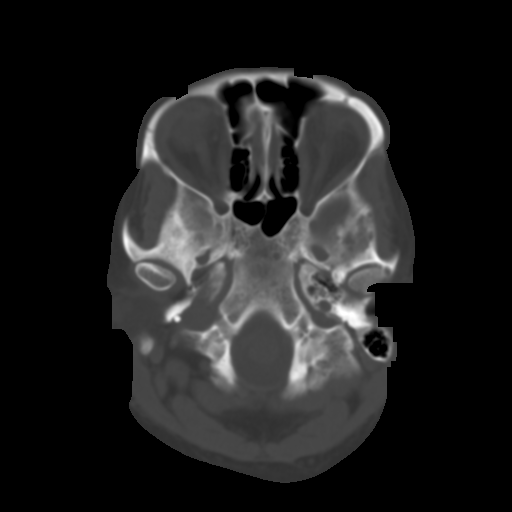
[im 6/31  brain]
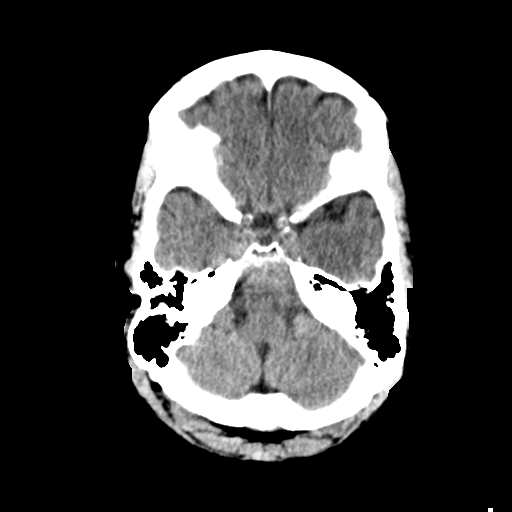
[im 9/31  brain]
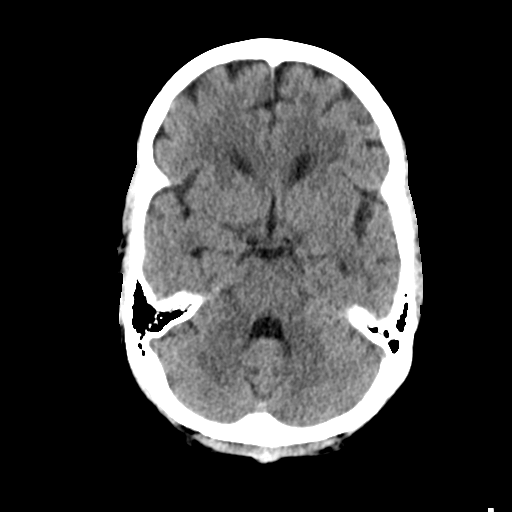
[im 11/31  brain]
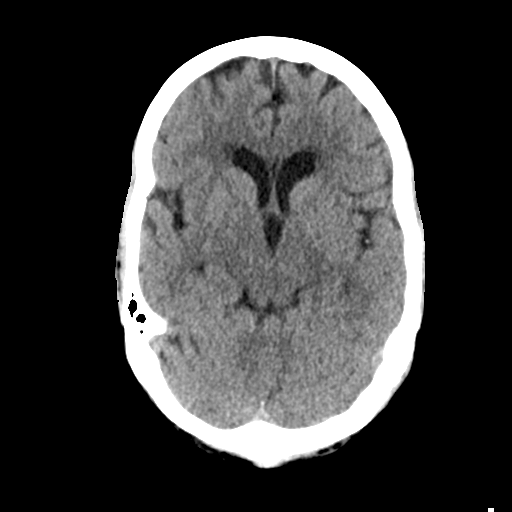
[im 14/31  brain]
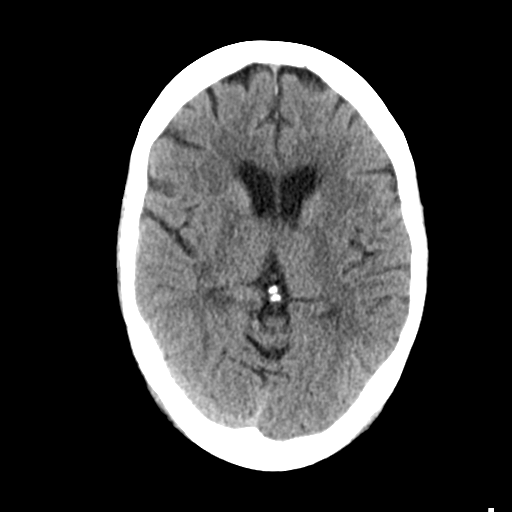
[im 14/31  bone]
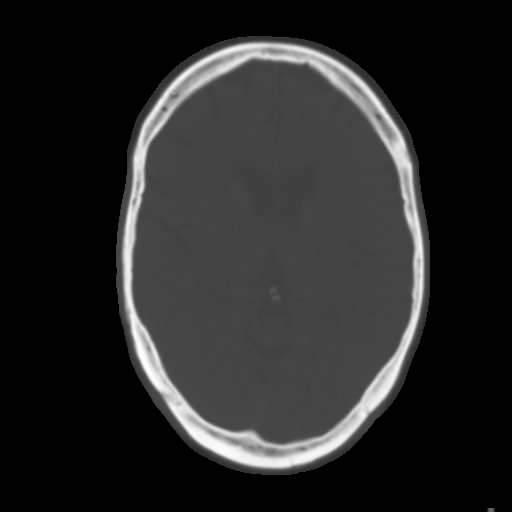
[im 17/31  brain]
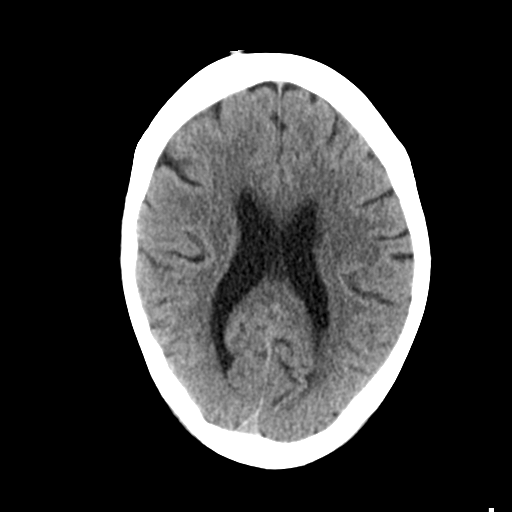
[im 20/31  brain]
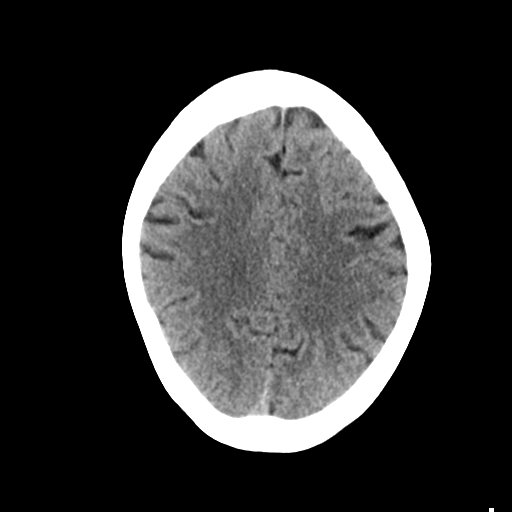
[im 23/31  brain]
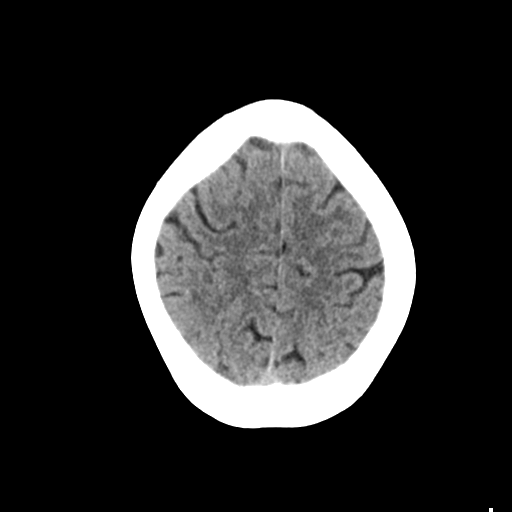
[im 25/31  brain]
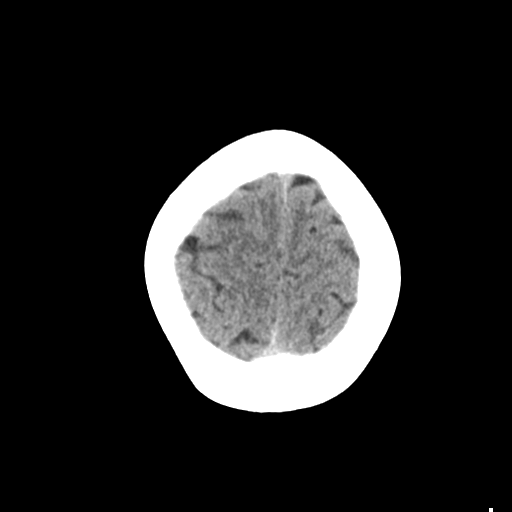
[im 25/31  bone]
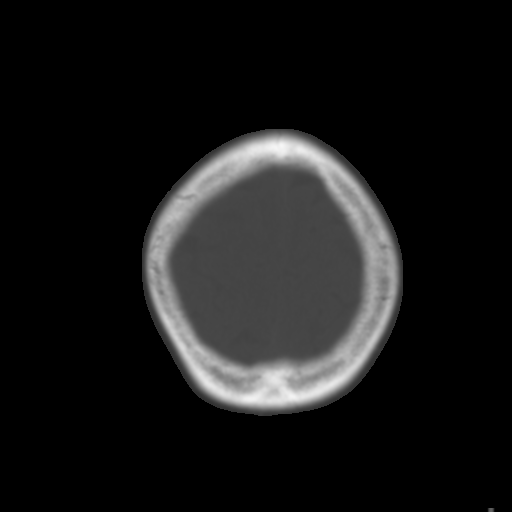
[im 28/31  brain]
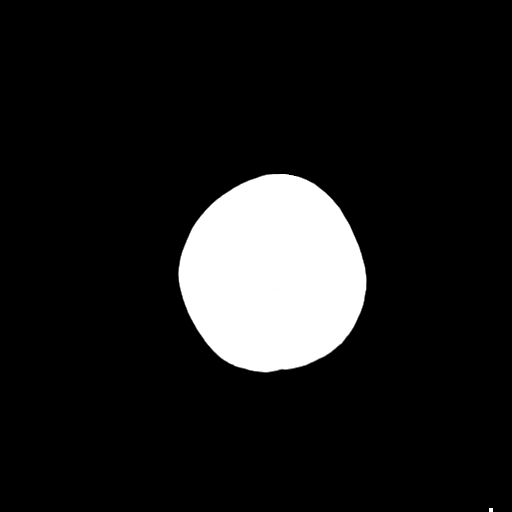

[Series 4: coronal soft tissue · coronal · 0.30mm/px · 3 of 65 slices shown]
[im 23/65  brain]
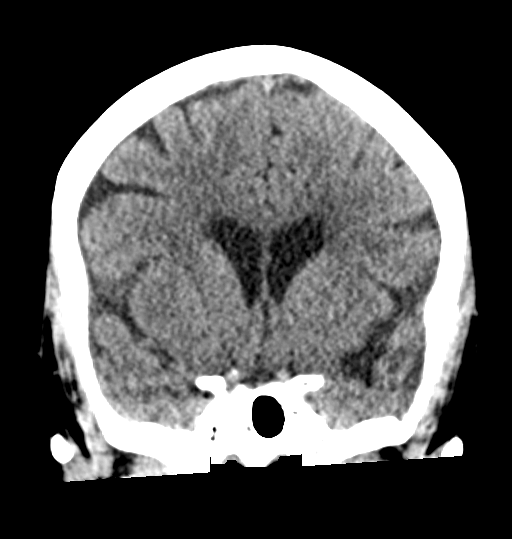
[im 29/65  brain]
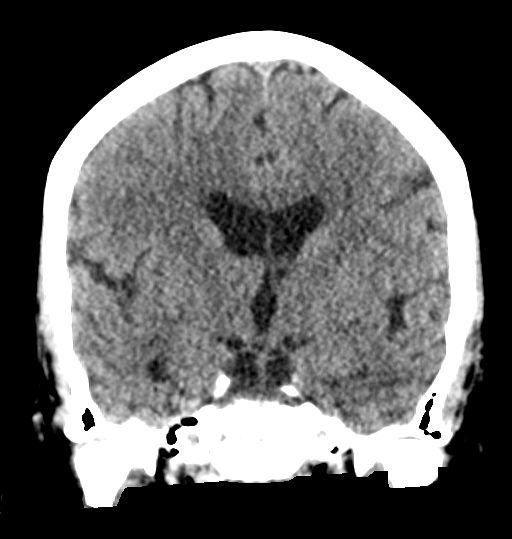
[im 36/65  brain]
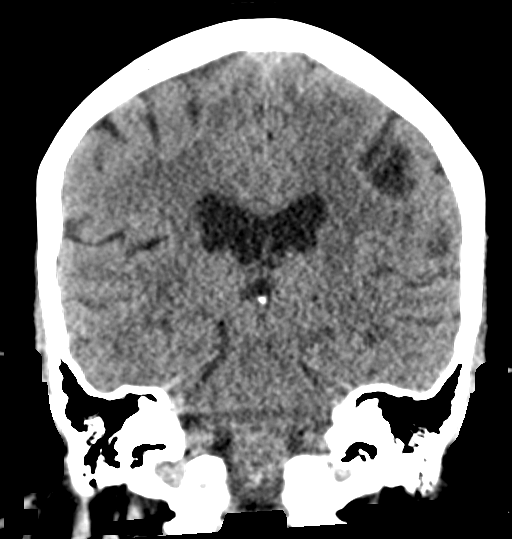

[Series 5: sagittal soft tissue · sagittal · 0.32mm/px · 3 of 50 slices shown]
[im 17/50  brain]
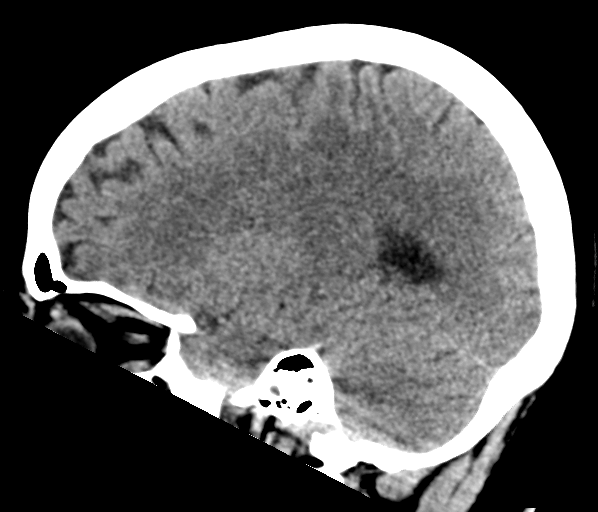
[im 25/50  brain]
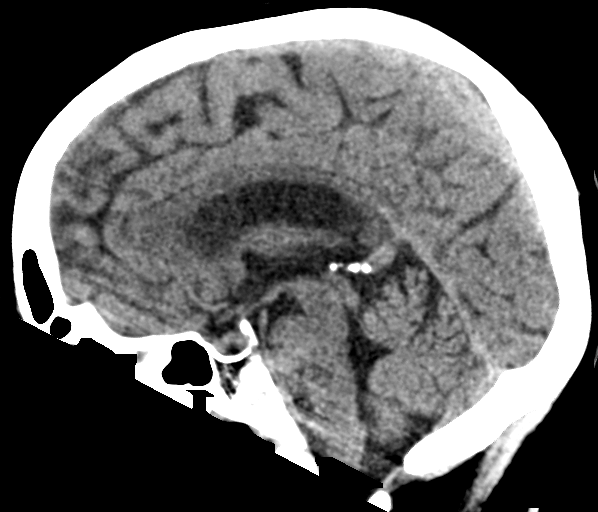
[im 33/50  brain]
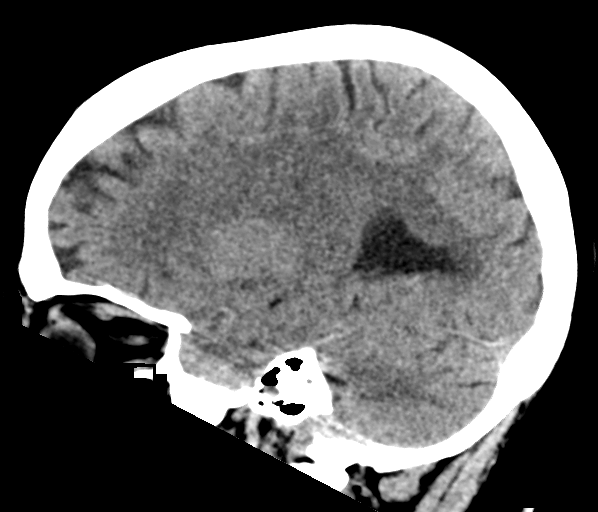

[16 of 47 positions shown; findings below may reference images not displayed]

FINDINGS: Brain: No evidence of acute infarction, hemorrhage, hydrocephalus,
extra-axial collection or mass lesion/mass effect.

Mild subcortical white matter and periventricular small vessel
ischemic changes.

Vascular: No hyperdense vessel or unexpected calcification.

Skull: Normal. Negative for fracture or focal lesion.

Sinuses/Orbits: The visualized paranasal sinuses are essentially
clear. The mastoid air cells are unopacified.

Other: None.
IMPRESSION: No evidence of acute intracranial abnormality.

Mild small vessel ischemic changes.

## 2019-11-04 IMAGING — CR CHEST - 2 VIEW
2 series · 2 of 2 positions shown · non-contrast
Comparison: Radiograph September 05, 2018.

CLINICAL DATA: Chills.

EXAM:
CHEST - 2 VIEW

[chest pa]
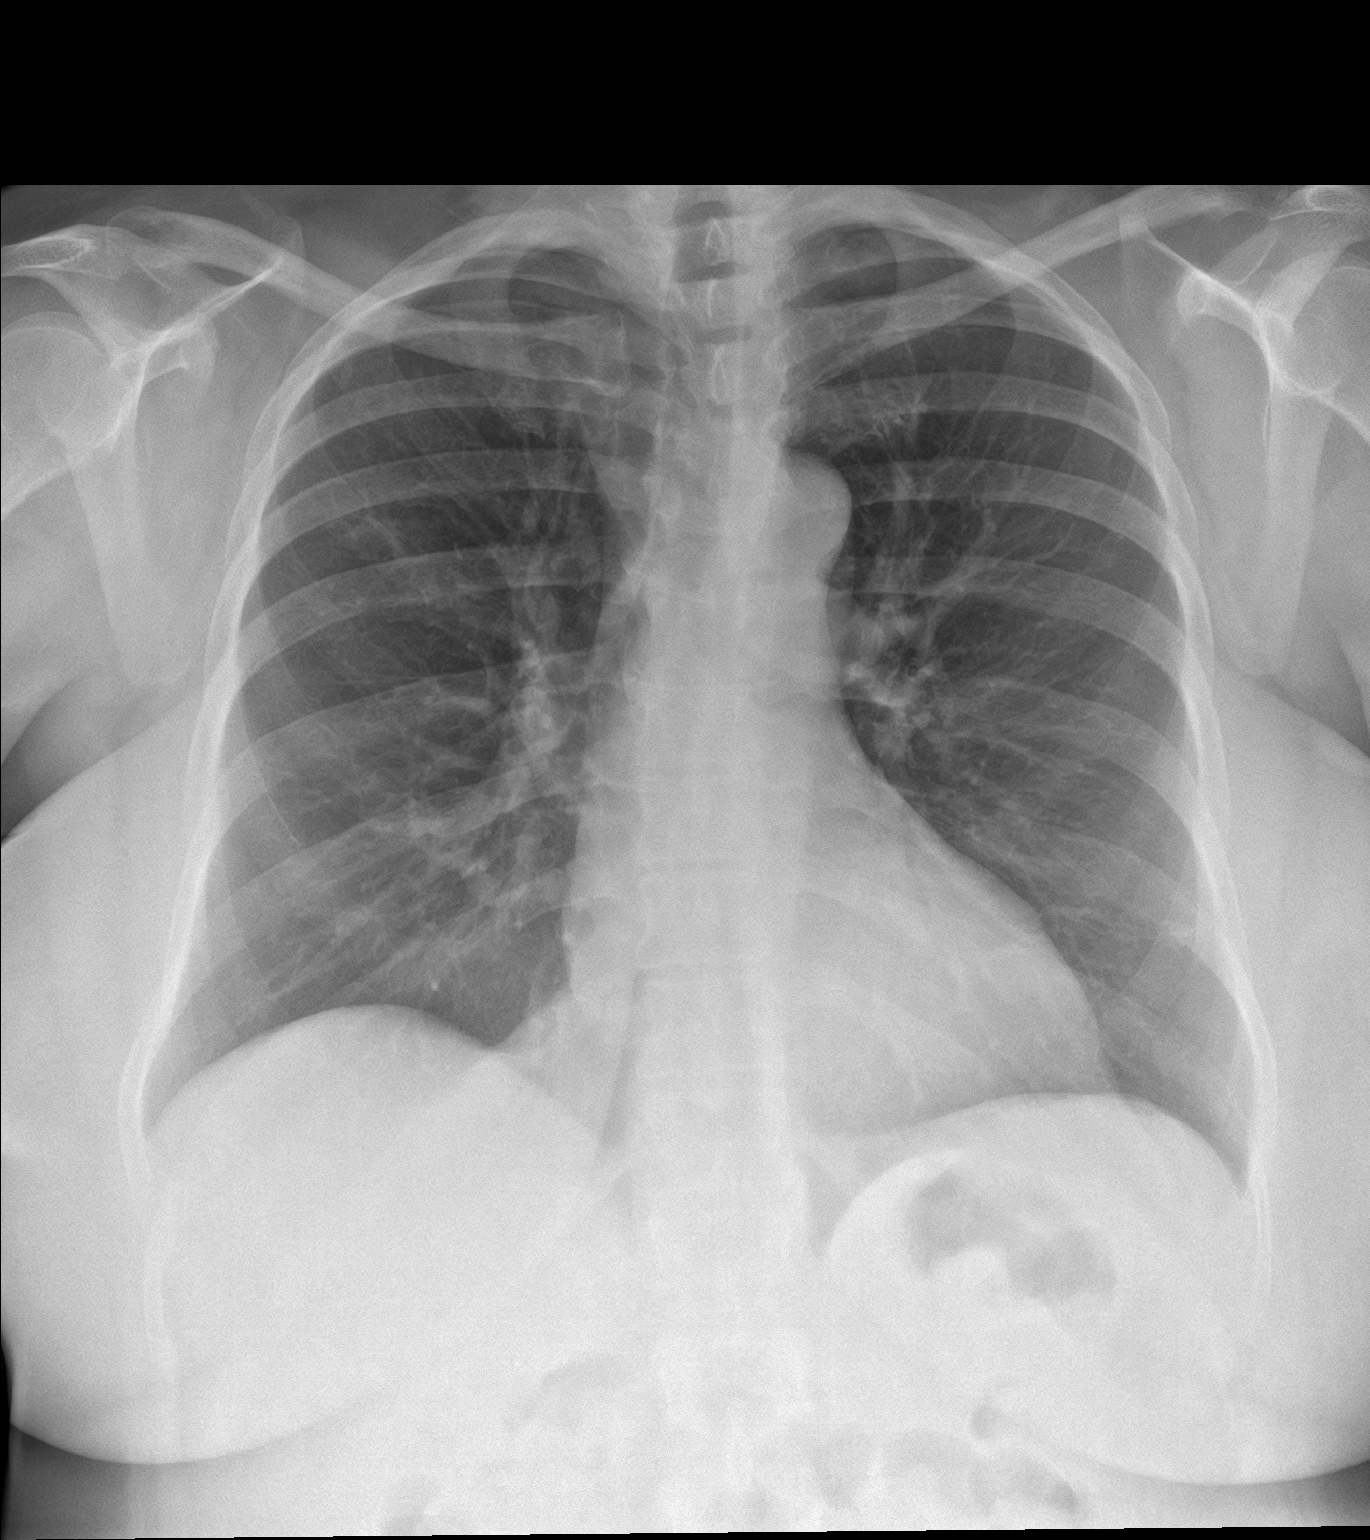

[chest lat]
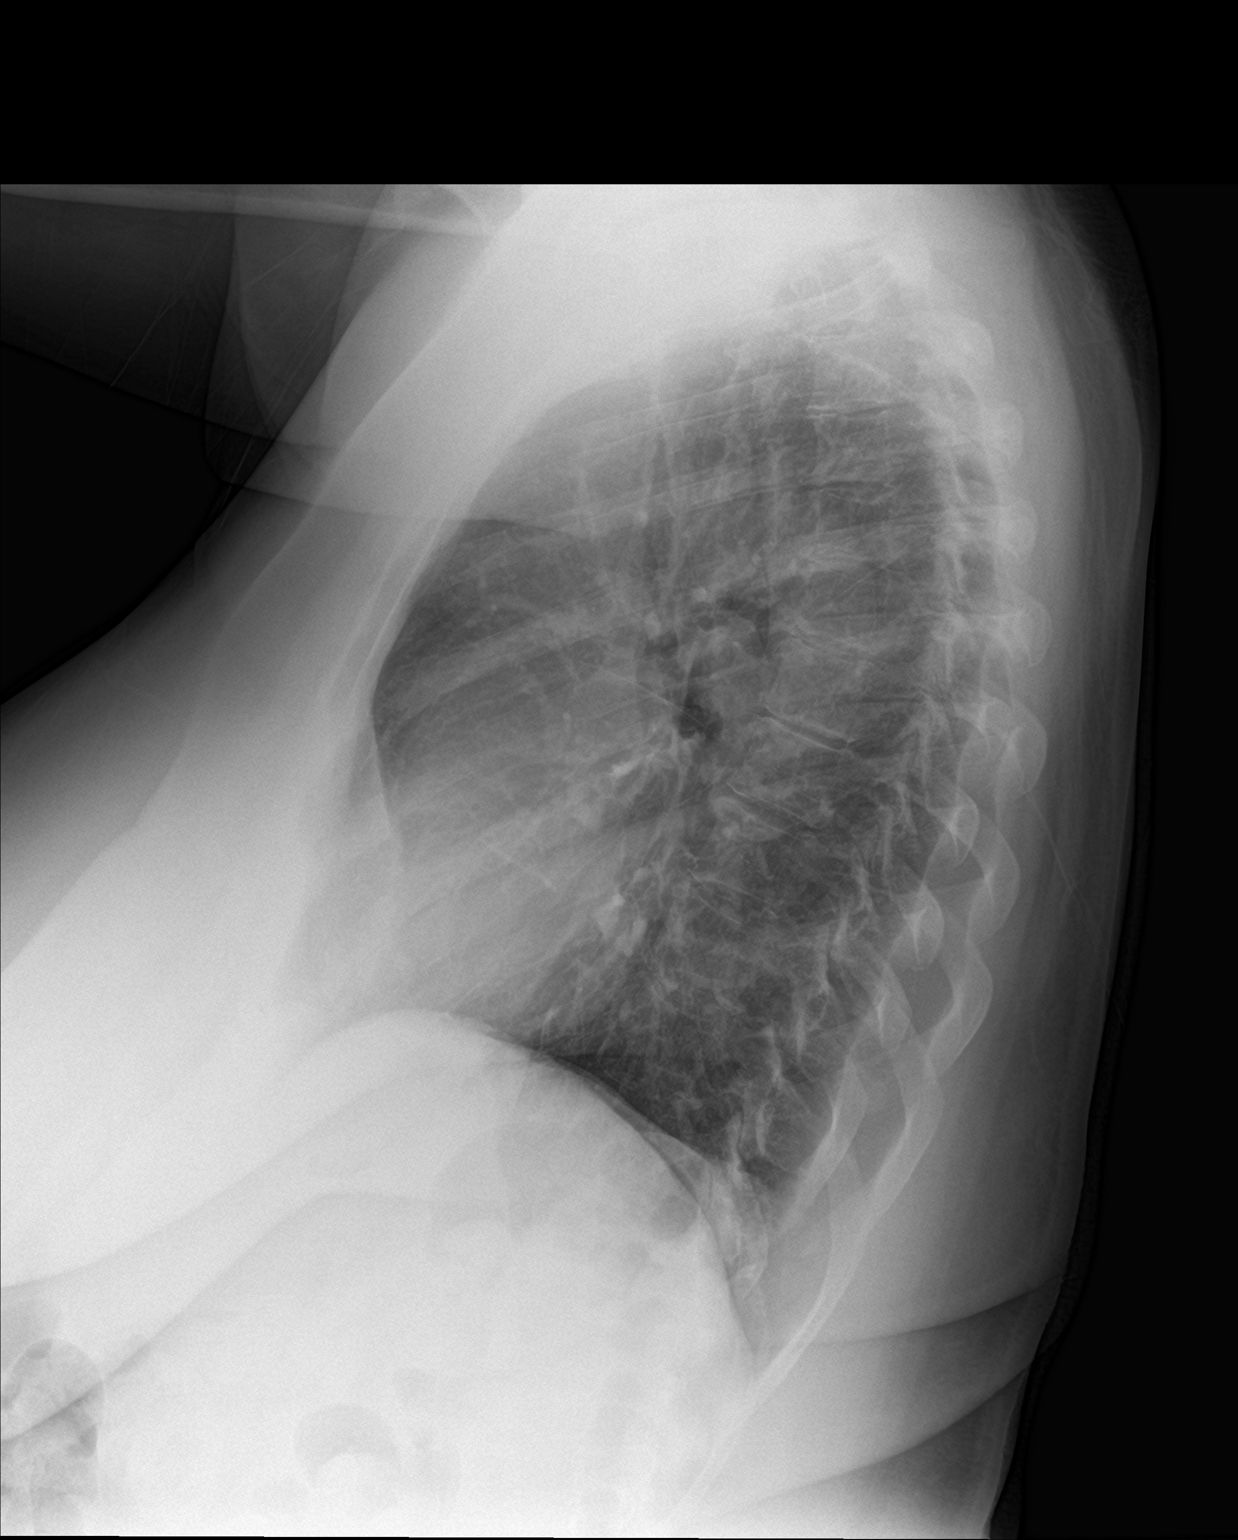

[2 of 2 positions shown; findings below may reference images not displayed]

FINDINGS: The heart size and mediastinal contours are within normal limits.
Both lungs are clear. The visualized skeletal structures are
unremarkable.
IMPRESSION: No active cardiopulmonary disease.

## 2020-04-01 IMAGING — US ULTRASOUND ABDOMEN LIMITED
1 series · 13 of 25 positions shown · non-contrast
Comparison: April 12, 2016

CLINICAL DATA: Elevated serum lipase

EXAM:
ULTRASOUND ABDOMEN LIMITED RIGHT UPPER QUADRANT

[Series 1: ultrasound abdomen limited · 0.23mm/px · 13 of 33 slices shown]
[im 1/33]
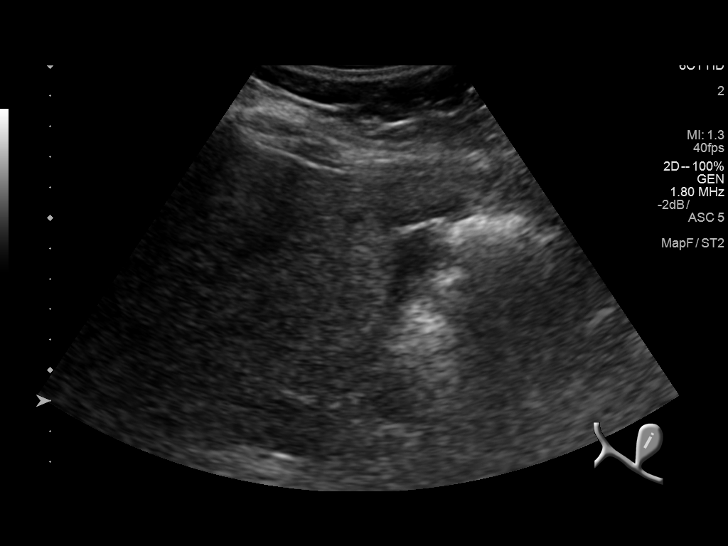
[im 3/33]
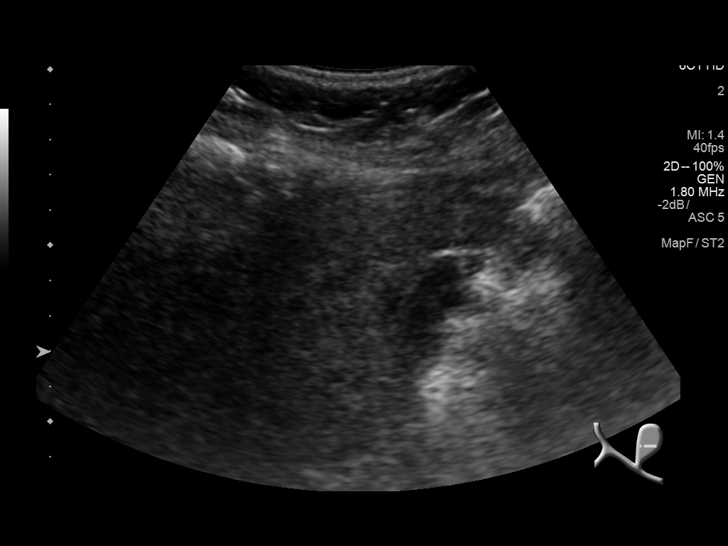
[im 6/33]
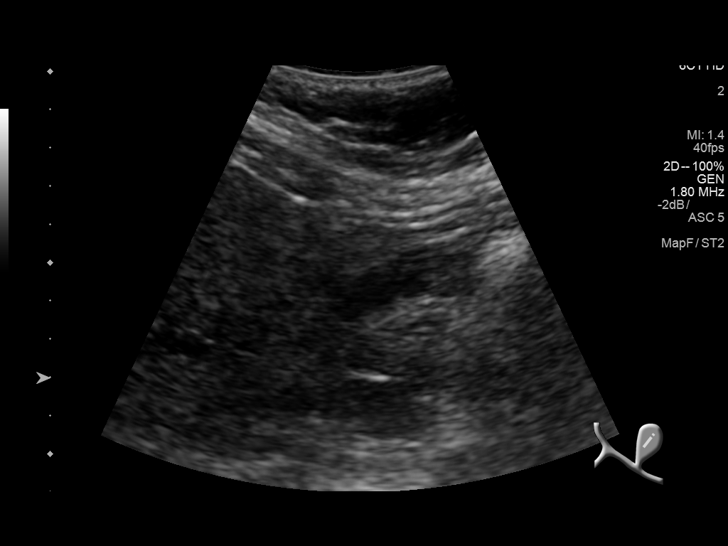
[im 9/33]
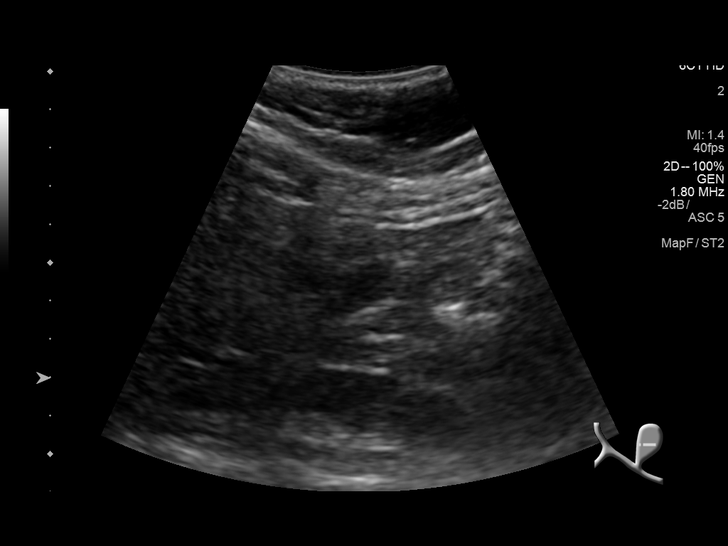
[im 11/33]
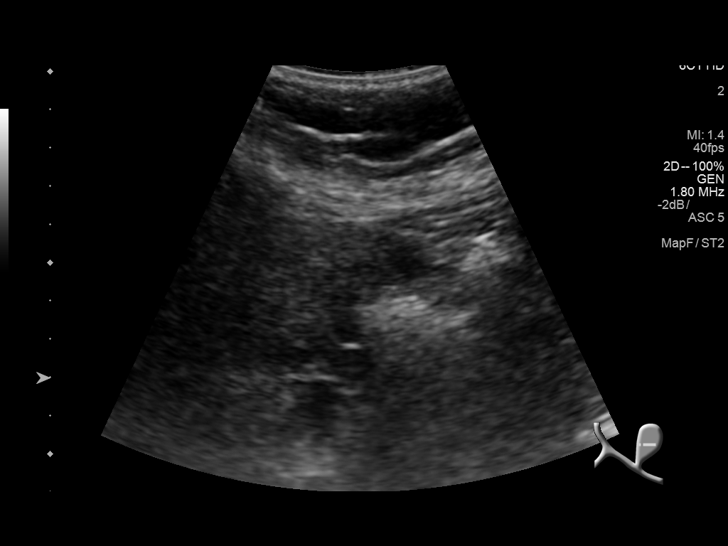
[im 14/33]
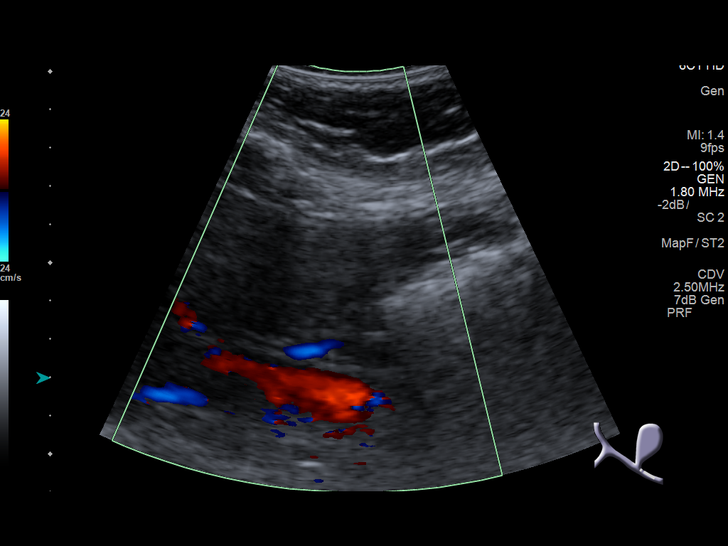
[im 17/33]
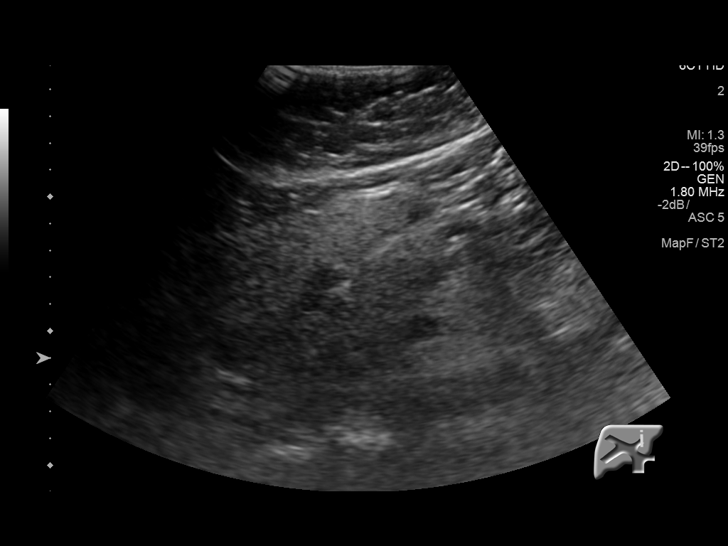
[im 19/33]
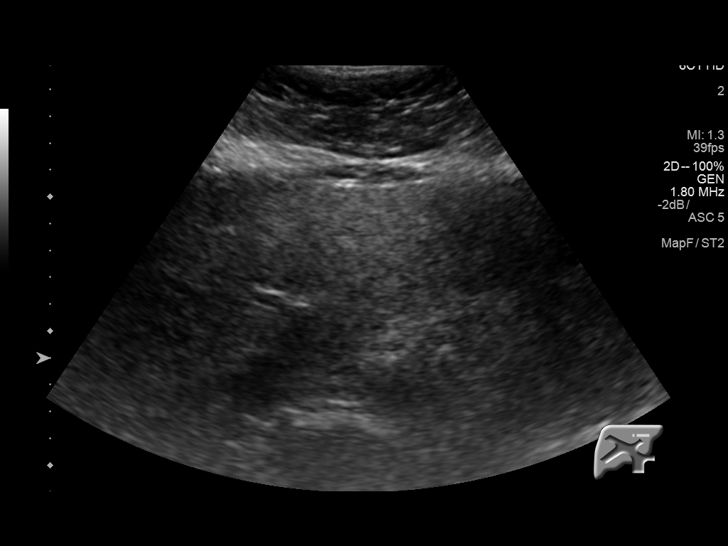
[im 22/33]
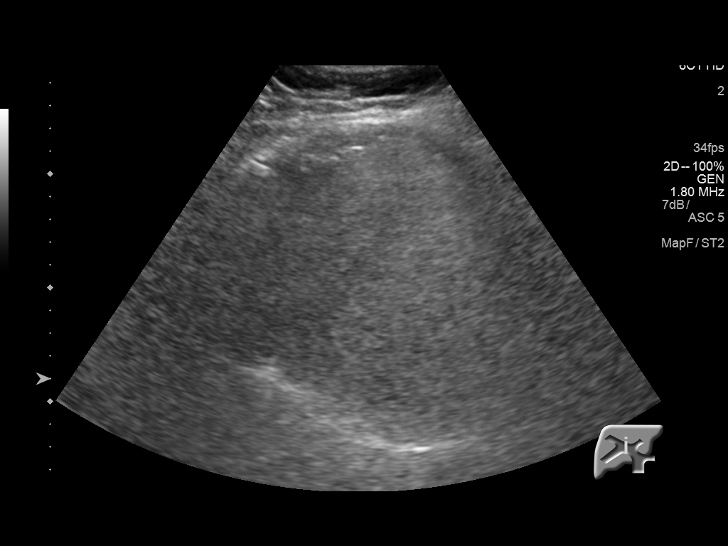
[im 25/33]
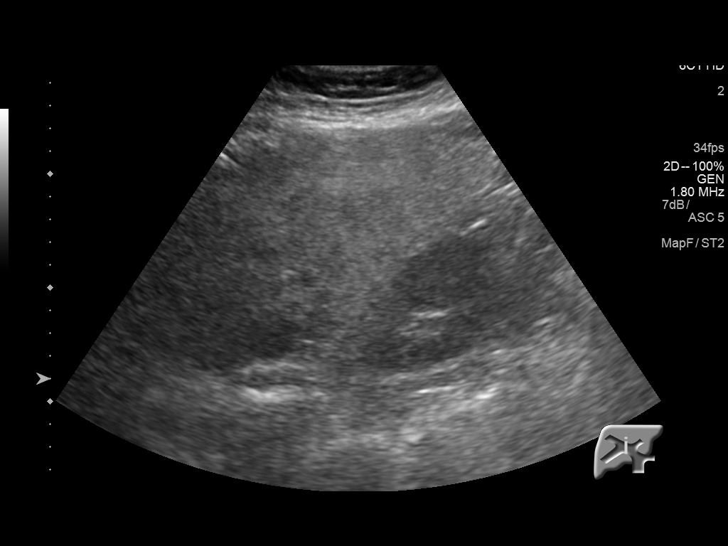
[im 27/33]
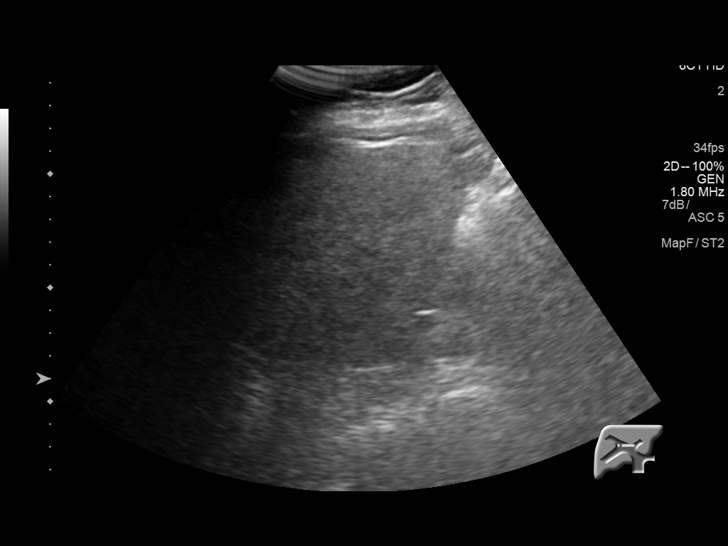
[im 30/33]
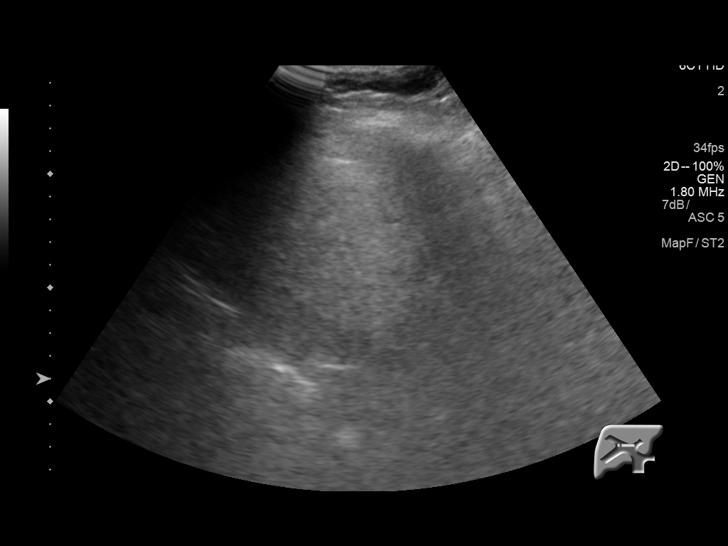
[im 33/33]
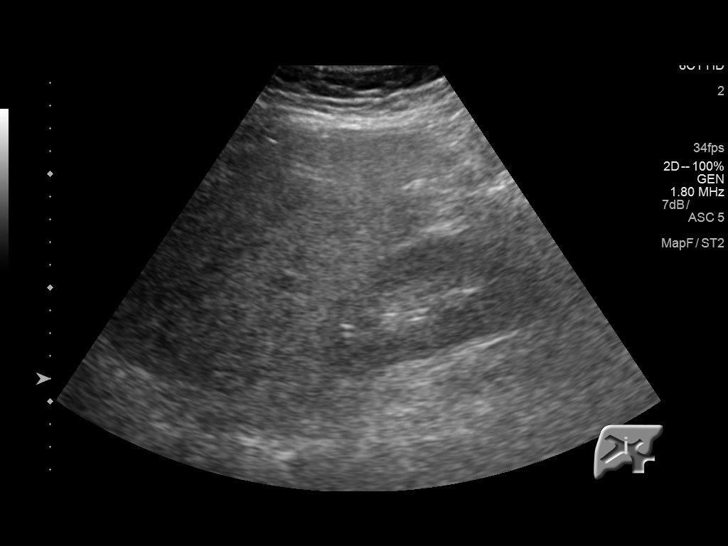

[13 of 25 positions shown; findings below may reference images not displayed]

FINDINGS: Gallbladder:

No gallstones are evident. The gallbladder wall is borderline
thickened with gallbladder appearing mildly contracted period no
pericholecystic fluid evident. No sonographic Murphy sign noted by
sonographer.

Common bile duct:

Diameter: 5 mm. No intrahepatic or extrahepatic biliary duct
dilatation.

Liver:

No focal lesion identified. Liver echogenicity is increased and
somewhat coarsened. Portal vein is patent on color Doppler imaging
with normal direction of blood flow towards the liver.
IMPRESSION: 1. Liver echogenicity is increased and somewhat coarsened. The
appearance is indicative of hepatic steatosis with questionable
underlying parenchymal liver disease. While no focal liver lesions
are evident, it must be cautioned that the sensitivity of ultrasound
for detection of focal liver lesions is diminished significantly in
this circumstance.

2. Gallbladder wall is slightly thickened without gallstones
evident. Gallbladder appears mildly contracted. Significance of the
mild thickening of the gallbladder wall is uncertain. In this
regard, it may be prudent to consider nuclear medicine hepatobiliary
imaging study to assess for cystic duct patency.
